# Patient Record
Sex: Male | Born: 1955
Health system: Southern US, Community
[De-identification: ages and names within clinical notes are randomized; demographics above are authoritative.]

## PROBLEM LIST (undated history)

## (undated) DIAGNOSIS — I2510 Atherosclerotic heart disease of native coronary artery without angina pectoris: Secondary | ICD-10-CM

## (undated) DIAGNOSIS — G43909 Migraine, unspecified, not intractable, without status migrainosus: Secondary | ICD-10-CM

## (undated) DIAGNOSIS — E7800 Pure hypercholesterolemia, unspecified: Secondary | ICD-10-CM

## (undated) DIAGNOSIS — J309 Allergic rhinitis, unspecified: Secondary | ICD-10-CM

## (undated) DIAGNOSIS — E785 Hyperlipidemia, unspecified: Secondary | ICD-10-CM

## (undated) DIAGNOSIS — G4730 Sleep apnea, unspecified: Secondary | ICD-10-CM

## (undated) DIAGNOSIS — E669 Obesity, unspecified: Secondary | ICD-10-CM

## (undated) DIAGNOSIS — I214 Non-ST elevation (NSTEMI) myocardial infarction: Secondary | ICD-10-CM

## (undated) DIAGNOSIS — J45909 Unspecified asthma, uncomplicated: Secondary | ICD-10-CM

## (undated) HISTORY — PX: CARDIAC CATHETERIZATION: SHX172

## (undated) HISTORY — DX: Obesity, unspecified: E66.9

## (undated) HISTORY — DX: Unspecified asthma, uncomplicated: J45.909

## (undated) HISTORY — DX: Allergic rhinitis, unspecified: J30.9

## (undated) HISTORY — DX: Non-ST elevation (NSTEMI) myocardial infarction: I21.4

## (undated) HISTORY — PX: NASAL SINUS SURGERY: SHX719

## (undated) HISTORY — DX: Atherosclerotic heart disease of native coronary artery without angina pectoris: I25.10

## (undated) HISTORY — DX: Pure hypercholesterolemia, unspecified: E78.00

## (undated) HISTORY — DX: Migraine, unspecified, not intractable, without status migrainosus: G43.909

## (undated) HISTORY — DX: Sleep apnea, unspecified: G47.30

## (undated) HISTORY — DX: Hyperlipidemia, unspecified: E78.5

---

## 1998-07-10 ENCOUNTER — Emergency Department (HOSPITAL_COMMUNITY): Admission: EM | Admit: 1998-07-10 | Discharge: 1998-07-10 | Payer: Self-pay | Admitting: Emergency Medicine

## 1998-07-10 ENCOUNTER — Encounter: Payer: Self-pay | Admitting: Endocrinology

## 2002-05-21 ENCOUNTER — Ambulatory Visit (HOSPITAL_COMMUNITY): Admission: RE | Admit: 2002-05-21 | Discharge: 2002-05-21 | Payer: Self-pay | Admitting: Gastroenterology

## 2006-05-03 ENCOUNTER — Inpatient Hospital Stay (HOSPITAL_COMMUNITY): Admission: RE | Admit: 2006-05-03 | Discharge: 2006-05-05 | Payer: Self-pay | Admitting: Cardiology

## 2006-05-03 ENCOUNTER — Encounter: Payer: Self-pay | Admitting: Emergency Medicine

## 2008-05-07 IMAGING — CR DG CHEST 2V
2 series · 2 of 2 positions shown · non-contrast
Comparison: none

HISTORY: Chest pain, dyspnea, hypertension

CHEST 2 VIEWS: 
Normal heart size, mediastinal contours, and vascularity.
Lungs clear.
No effusion or pneumothorax.
Bones unremarkable.

[w chest pa]
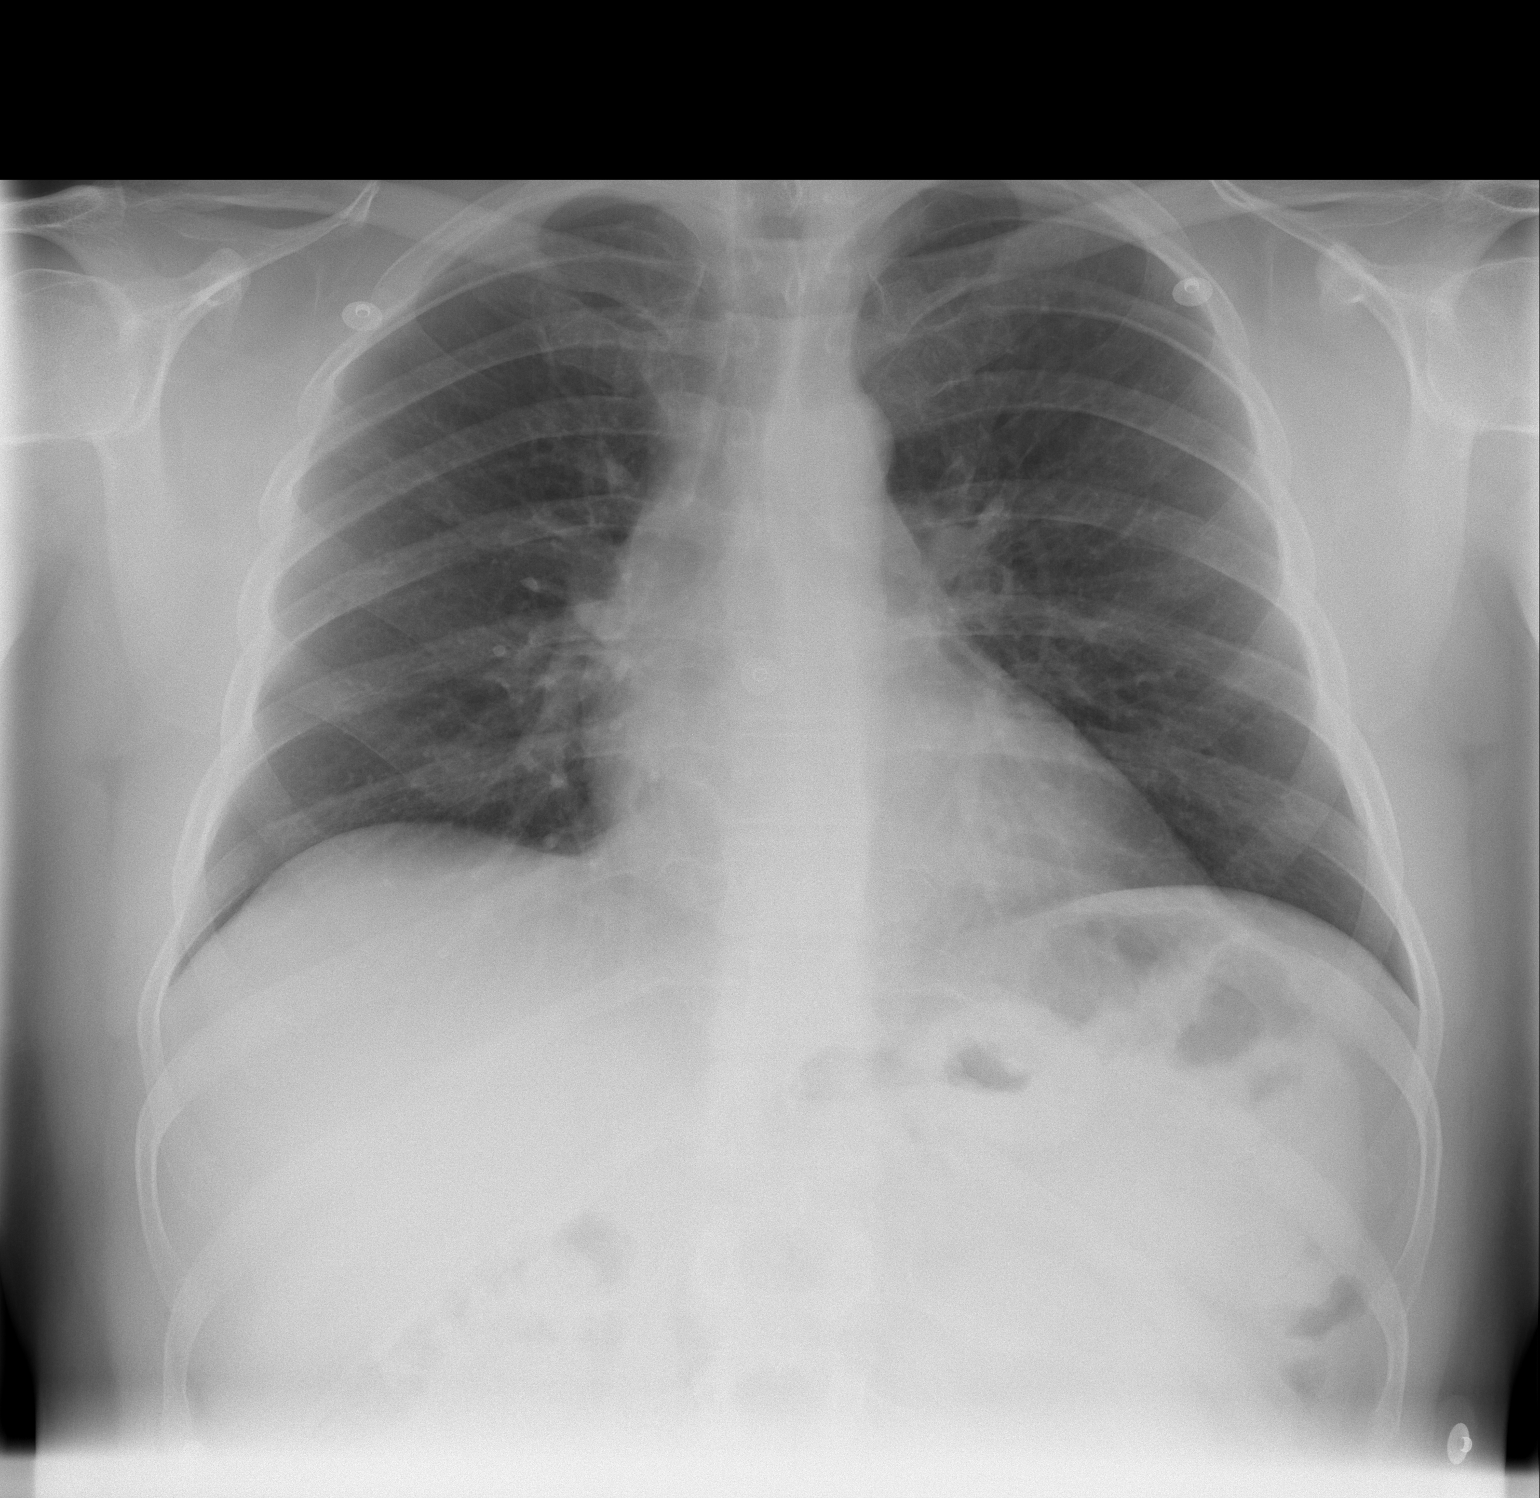

[w chest lat *]
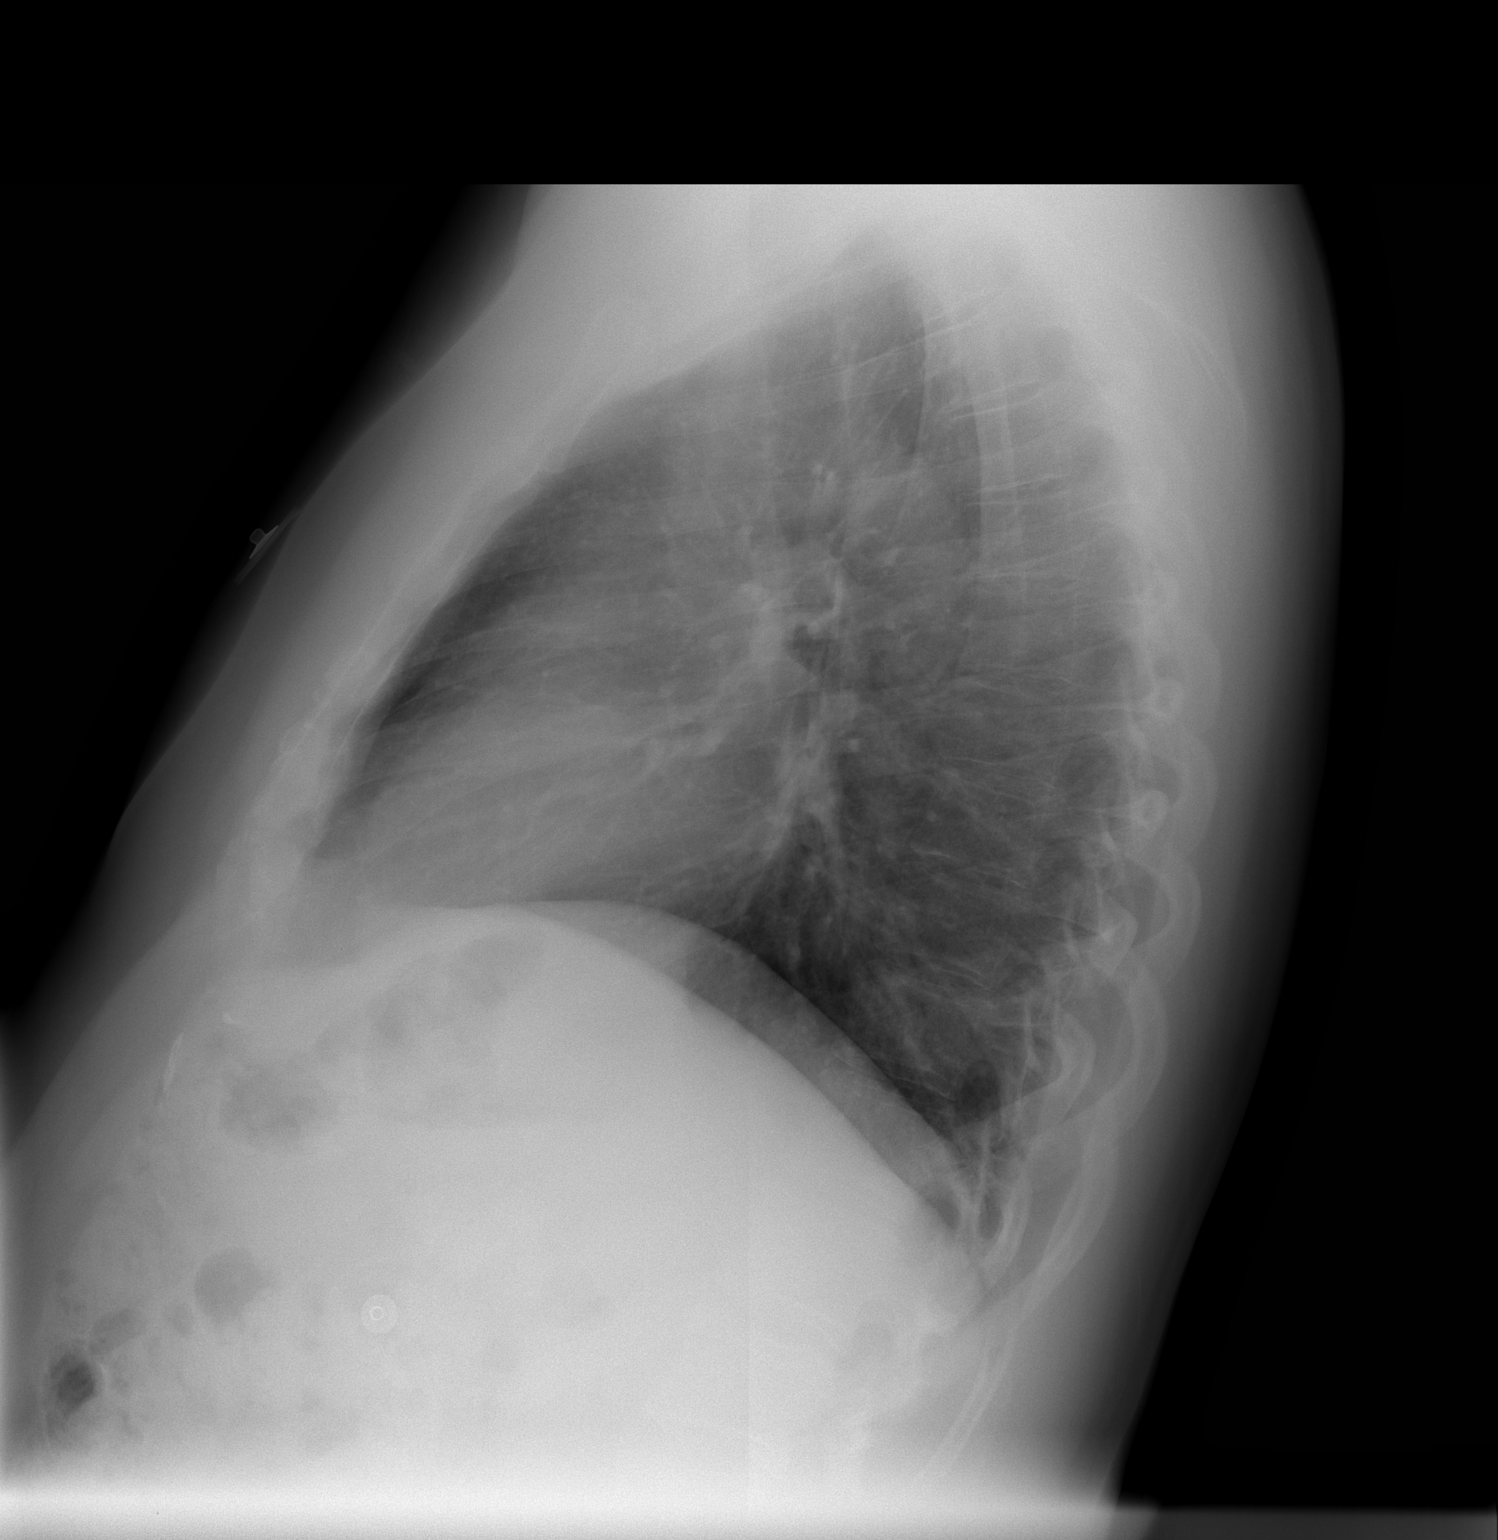

[2 of 2 positions shown; findings below may reference images not displayed]

IMPRESSION: No acute abnormalities.

## 2012-08-07 ENCOUNTER — Other Ambulatory Visit: Payer: Self-pay | Admitting: Cardiovascular Disease

## 2012-08-07 NOTE — Telephone Encounter (Signed)
Patient has not been seen since 02/28/2011. Has has multiple warnings. Rx request denied, sent to pharmacy electronically.

## 2013-03-10 ENCOUNTER — Encounter: Payer: Self-pay | Admitting: Cardiology

## 2013-03-10 ENCOUNTER — Encounter: Payer: Self-pay | Admitting: *Deleted

## 2013-03-17 ENCOUNTER — Ambulatory Visit: Payer: Self-pay | Admitting: Cardiology

## 2013-03-26 ENCOUNTER — Ambulatory Visit: Payer: 59 | Admitting: Cardiology

## 2013-04-02 ENCOUNTER — Encounter: Payer: Self-pay | Admitting: General Surgery

## 2013-04-02 DIAGNOSIS — E669 Obesity, unspecified: Secondary | ICD-10-CM | POA: Insufficient documentation

## 2013-04-02 DIAGNOSIS — G4733 Obstructive sleep apnea (adult) (pediatric): Secondary | ICD-10-CM | POA: Insufficient documentation

## 2013-04-03 ENCOUNTER — Ambulatory Visit: Payer: Self-pay | Admitting: Cardiology

## 2013-04-11 ENCOUNTER — Ambulatory Visit: Payer: 59 | Admitting: Cardiology

## 2013-04-29 ENCOUNTER — Encounter: Payer: Self-pay | Admitting: Cardiology

## 2013-04-29 ENCOUNTER — Ambulatory Visit (INDEPENDENT_AMBULATORY_CARE_PROVIDER_SITE_OTHER): Payer: 59 | Admitting: Cardiology

## 2013-04-29 VITALS — BP 140/80 | HR 61 | Ht 69.0 in | Wt 231.0 lb

## 2013-04-29 DIAGNOSIS — E669 Obesity, unspecified: Secondary | ICD-10-CM

## 2013-04-29 DIAGNOSIS — I252 Old myocardial infarction: Secondary | ICD-10-CM

## 2013-04-29 DIAGNOSIS — I10 Essential (primary) hypertension: Secondary | ICD-10-CM | POA: Insufficient documentation

## 2013-04-29 DIAGNOSIS — I2510 Atherosclerotic heart disease of native coronary artery without angina pectoris: Secondary | ICD-10-CM

## 2013-04-29 DIAGNOSIS — G4733 Obstructive sleep apnea (adult) (pediatric): Secondary | ICD-10-CM

## 2013-04-29 DIAGNOSIS — E7800 Pure hypercholesterolemia, unspecified: Secondary | ICD-10-CM | POA: Insufficient documentation

## 2013-04-29 MED ORDER — SIMVASTATIN 40 MG PO TABS: 40.0000 mg | ORAL_TABLET | Freq: Every day | ORAL | Status: DC

## 2013-04-29 MED ORDER — METOPROLOL SUCCINATE ER 25 MG PO TB24: 25.0000 mg | ORAL_TABLET | Freq: Every day | ORAL | Status: DC

## 2013-04-29 NOTE — Progress Notes (Signed)
[address], [address] [place], [place]  [address] Phone: [phone] Fax:  [phone]  Date:  04/29/2013   ID:  Christopher Mcpherson, DOB 02-07-1956, MRN [id number]  PCP:  Mickie Hillier, MD   History of Present Illness: Christopher Mcpherson is a 58 y.o. male with coronary artery disease, former patient of Dr. Caprice Kluver with history of myocardial infarction in 2008 where catheterization showed a small ramus branch that was occluded, EF 65%. This myocardial infarction occurred after using Imitrex for her migraine headache. Percutaneous intervention to the small branch was unsuccessful. Obstructive sleep apnea is also being followed by Dr. Mayford Knife and he is treated with BiPAP. He has been on simvastatin as well as niacin for hyperlipidemia and his last LDL was 87 (prior 76). He has been taking metoprolol once a day as well. overall, no symptoms, no syncope, no chest pain, dyspnea with exertion. He has gained some weight over the holidays likely from not exercising as much. He is no longer able to walk 3 miles a day at work.    Wt Readings from Last 3 Encounters:  04/29/13 231 lb (104.781 kg)     Past Medical History  Diagnosis Date  . Hypercholesteremia   . Allergic rhinitis   . Sleep apnea   . Migraine     followed by neurologist Dr.Lewit  . Acute myocardial infarction, subendocardial infarction, subsequent episode of care   . Obesity   . Dyslipidemia   . Allergic rhinitis   . CAD (coronary artery disease)     MI 2008 (cardio Dr. Caprice Kluver), catheterization showed a small ramus branch occluded, nonQ wave MI, ejection fraction 65%, occurred after using Imitrex for migraine, PTCA unsuccessful  . Asthma     Mild intermittent    Past Surgical History  Procedure Laterality Date  . Nasal sinus surgery    . Cardiac catheterization      Showed a small ramus branch occluded,nonQ wave MI, ejection fraction 65% occured after using Imitrex for Migraine,PTCA unsuccessful     Current Outpatient Prescriptions  Medication Sig Dispense Refill  . albuterol (PROAIR HFA) 108 (90 BASE) MCG/ACT inhaler Inhale into the lungs every 6 (six) hours as needed for wheezing or shortness of breath.      Marland Kitchen aspirin 81 MG tablet Take 81 mg by mouth daily.      . metoprolol succinate (TOPROL-XL) 25 MG 24 hr tablet Take 25 mg by mouth daily.      . niacin 500 MG tablet Take 1,500 mg by mouth at bedtime.       . Omega-3 Fatty Acids (FISH OIL) 1000 MG CAPS Take 1 capsule by mouth daily.      . simvastatin (ZOCOR) 40 MG tablet Take 40 mg by mouth daily.      Marland Kitchen triamcinolone (NASACORT AQ) 55 MCG/ACT AERO nasal inhaler Place 1 spray into the nose daily.       No current facility-administered medications for this visit.    Allergies:    Allergies  Allergen Reactions  . Imitrex [Sumatriptan] Other (See Comments)    Myocardial infarction    Social History:  The patient  reports that he has never smoked. He does not have any smokeless tobacco history on file. He reports that he drinks alcohol. He reports that he does not use illicit drugs.   ROS:  Please see the history of present illness.   Denies any fevers, chills, orthopnea, PND  PHYSICAL EXAM: VS:  BP 140/80  Pulse 61  Ht 5\' 9"  (1.753 m)  Wt 231 lb (104.781 kg)  BMI 34.10 kg/m2 Well nourished, well developed, in no acute distress HEENT: normal Neck: no JVD Cardiac:  normal S1, S2; RRR; no murmur Lungs:  clear to auscultation bilaterally, no wheezing, rhonchi or rales Abd: soft, nontender, no hepatomegaly Ext: no edema Skin: warm and dry Neuro: no focal abnormalities noted  EKG:  Sinus rhythm, 61, T wave inversion, mild aVL, borderline LVH.    Labs: 4/14-LDL 87, previously 75 ASSESSMENT AND PLAN:  1. Coronary artery disease - stable. No active anginal symptoms. Doing well. Continue with aggressive secondary prevention. 2. Hyperlipidemia-LDL most recently 87. He will be having this checked by Dr. Clarene DukeLittle soon. LDL  goal should be 70. Continue for now with current medications. Diet, exercise. 3. Obesity-continue to encourage weight loss. He states that he has changed jobs 3 times in the past year and this has inhibited his workout routine. 4. Hypertension-mildly elevated today however normally is in the 120 range at home. Continue with current medications. 5. OSA - Dr. Mayford Knifeurner 6. Old myocardial infarction - 2008, ramus branch, no PCI. 7. Follow up in one year.  Signed, Donato SchultzMark Landy Dunnavant, MD [facility]  04/29/2013 12:11 PM

## 2013-04-29 NOTE — Patient Instructions (Signed)
Your physician recommends that you continue on your current medications as directed. Please refer to the Current Medication list given to you today.  Your physician wants you to follow-up in: 1 year with Dr. Skains. You will receive a reminder letter in the mail two months in advance. If you don't receive a letter, please call our office to schedule the follow-up appointment.  

## 2014-05-04 ENCOUNTER — Other Ambulatory Visit: Payer: Self-pay | Admitting: Cardiology

## 2015-01-05 ENCOUNTER — Ambulatory Visit (INDEPENDENT_AMBULATORY_CARE_PROVIDER_SITE_OTHER): Payer: BLUE CROSS/BLUE SHIELD | Admitting: Cardiology

## 2015-01-05 ENCOUNTER — Encounter: Payer: Self-pay | Admitting: Cardiology

## 2015-01-05 VITALS — BP 120/70 | HR 73 | Ht 68.0 in | Wt 234.1 lb

## 2015-01-05 DIAGNOSIS — E669 Obesity, unspecified: Secondary | ICD-10-CM | POA: Diagnosis not present

## 2015-01-05 DIAGNOSIS — G4733 Obstructive sleep apnea (adult) (pediatric): Secondary | ICD-10-CM

## 2015-01-05 MED ORDER — METOPROLOL SUCCINATE ER 25 MG PO TB24: 25.0000 mg | ORAL_TABLET | Freq: Every day | ORAL | Status: DC

## 2015-01-05 MED ORDER — SIMVASTATIN 40 MG PO TABS: 40.0000 mg | ORAL_TABLET | Freq: Every day | ORAL | Status: DC

## 2015-01-05 NOTE — Progress Notes (Signed)
Cardiology Office Note   Date:  01/05/2015   ID:  Christopher Mcpherson, DOB October 14, 1955, MRN [id number]  PCP:  Mickie Hillier, MD    Chief Complaint  Patient presents with  . Sleep Apnea    refilled card meds      History of Present Illness: Christopher Mcpherson is a 59 y.o. male who presents for followup of his OSA.  He has OSA and is on BiPAP.  He uses a full face mask which he tolerates well.  He is on BiPAP at 12/8cm H2O.  He feels rested when he gets up in the am.  He does not snore when using the BiPAP.  He has no daytime sleepiness.  He uses [facility] for his supplies.  He sleeps on his side.  He has problems with allergies so he does have some nasal congestion but is controlled on nasal saline spray.    Past Medical History  Diagnosis Date  . Hypercholesteremia   . Allergic rhinitis   . Sleep apnea   . Migraine     followed by neurologist Dr.Lewit  . Acute myocardial infarction, subendocardial infarction, subsequent episode of care (HCC)   . Obesity   . Dyslipidemia   . Allergic rhinitis   . CAD (coronary artery disease)     MI 2008 (cardio Dr. Caprice Kluver), catheterization showed a small ramus branch occluded, nonQ wave MI, ejection fraction 65%, occurred after using Imitrex for migraine, PTCA unsuccessful  . Asthma     Mild intermittent    Past Surgical History  Procedure Laterality Date  . Nasal sinus surgery    . Cardiac catheterization      Showed a small ramus branch occluded,nonQ wave MI, ejection fraction 65% occured after using Imitrex for Migraine,PTCA unsuccessful     Current Outpatient Prescriptions  Medication Sig Dispense Refill  . albuterol (PROAIR HFA) 108 (90 BASE) MCG/ACT inhaler Inhale into the lungs every 6 (six) hours as needed for wheezing or shortness of breath.    Marland Kitchen aspirin 81 MG tablet Take 81 mg by mouth daily.    . metoprolol succinate (TOPROL-XL) 25 MG 24 hr tablet TAKE 1 TABLET BY MOUTH EVERY DAY 30  tablet 0  . niacin 500 MG tablet Take 1,500 mg by mouth at bedtime.     . Omega-3 Fatty Acids (FISH OIL) 1000 MG CAPS Take 1 capsule by mouth daily.    . simvastatin (ZOCOR) 40 MG tablet TAKE 1 TABLET BY MOUTH EVERY DAY 30 tablet 0  . triamcinolone (NASACORT AQ) 55 MCG/ACT AERO nasal inhaler Place 1 spray into the nose daily.     No current facility-administered medications for this visit.    Allergies:   Imitrex    Social History:  The patient  reports that he has never smoked. He does not have any smokeless tobacco history on file. He reports that he drinks alcohol. He reports that he does not use illicit drugs.   Family History:  The patient's family history includes Diabetes in his father.    ROS:  Please see the history of present illness.   Otherwise, review of systems are positive for none.   All other systems are reviewed and negative.    PHYSICAL EXAM: VS:  BP 120/70 mmHg  Pulse 73  Ht  (1.727 m)  Wt 106.196 kg (234 lb 1.9 oz)  BMI 35.61 kg/m2 ,  BMI Body mass index is 35.61 kg/(m^2). GEN: Well nourished, well developed, in no acute distress HEENT: normal Neck: no JVD, carotid bruits, or masses Cardiac: RRR; no murmurs, rubs, or gallops,no edema  Respiratory:  clear to auscultation bilaterally, normal work of breathing GI: soft, nontender, nondistended, + BS MS: no deformity or atrophy Skin: warm and dry, no rash Neuro:  Strength and sensation are intact Psych: euthymic mood, full affect   EKG:  EKG is not ordered today.    Recent Labs: No results found for requested labs within last 365 days.    Lipid Panel No results found for: CHOL, TRIG, HDL, CHOLHDL, VLDL, LDLCALC, LDLDIRECT    Wt Readings from Last 3 Encounters:  01/05/15 106.196 kg (234 lb 1.9 oz)  04/29/13 104.781 kg (231 lb)       ASSESSMENT AND PLAN:  1.  OSA on BiPAP and doing well.  [facility].  Patient has been using and benefiting from CPAP use and  will continue to benefit from therapy.  2.  Obesity - He walks 2 miles daily.     Current medicines are reviewed at length with the patient today.  The patient does not have concerns regarding medicines.  The following changes have been made:  no change  Labs/ tests ordered today: See above Assessment and Plan No orders of the defined types were placed in this encounter.     Disposition:   FU with me in 1 year  Signed, Quintella ReichertURNER,TRACI R, MD  01/05/2015 9:03 AM    [facility] [address], [place], [place]  [address] Phone: [phone]; Fax: [phone]

## 2015-01-05 NOTE — Patient Instructions (Signed)
Medication Instructions:  Your physician recommends that you continue on your current medications as directed. Please refer to the Current Medication list given to you today.   Labwork: None  Testing/Procedures: None  Follow-Up: Your physician recommends that you schedule a follow-up appointment with Dr. Anne FuSkains.  Your physician wants you to follow-up in: 1 year with Dr. Mayford Knifeurner. You will receive a reminder letter in the mail two months in advance. If you don't receive a letter, please call our office to schedule the follow-up appointment.   Any Other Special Instructions Will Be Listed Below (If Applicable). Orders have been placed for you to be set up with [facility]. If you do not hear from them within the next week, call Toma CopierBethany, CMA directly at [phone]. She is our office CPAP assistant.   If you need a refill on your cardiac medications before your next appointment, please call your pharmacy.

## 2015-01-06 ENCOUNTER — Telehealth: Payer: Self-pay | Admitting: *Deleted

## 2015-01-06 NOTE — Telephone Encounter (Signed)
Then following is copied from messages with [facility]  ----- Message -----   From: Arcola JanskyBethany M Cook, CMA   Sent: 01/06/2015  9:40 AM    To: Henderson NewcomerMelissa Stenson, Henrietta DineKathryn A Kemp, RN  Subject: RE: DME order                   I have made some phone calls on getting this. Not sure that it will be possible - but if so, it'll take a few days. His sleep study was done as a part of [facility]....both of which are not operating now, so I'm trying to track down where those records went.   Will keep you updated.  Patient is aware that I am trying to get records.    Thanks,  Toma CopierBethany  ----- Message -----   From: Henderson NewcomerMelissa Stenson   Sent: 01/05/2015  6:14 PM    To: Arcola JanskyBethany M Cook, CMA, Henrietta DineKathryn A Kemp, RN  Subject: RE: DME order                   Hey ladies. Since he'll be new to us, we'll need a copy of his sleep study. I don't see a copy in his chart.  Can y'all get a copy?  ----- Message -----   From: Henrietta DineKathryn A Kemp, RN   Sent: 01/05/2015  9:09 AM    To: Henderson NewcomerMelissa Stenson, Shayley Medlin Jenelle MagesM Cook, [place]  Subject: DME order                     [facility]   Can we get a download too?   Thanks!!

## 2015-02-19 ENCOUNTER — Encounter: Payer: Self-pay | Admitting: Cardiology

## 2015-02-19 ENCOUNTER — Ambulatory Visit (INDEPENDENT_AMBULATORY_CARE_PROVIDER_SITE_OTHER): Payer: BLUE CROSS/BLUE SHIELD | Admitting: Cardiology

## 2015-02-19 VITALS — BP 124/74 | HR 65 | Ht 68.0 in | Wt 238.4 lb

## 2015-02-19 DIAGNOSIS — I10 Essential (primary) hypertension: Secondary | ICD-10-CM

## 2015-02-19 DIAGNOSIS — I2510 Atherosclerotic heart disease of native coronary artery without angina pectoris: Secondary | ICD-10-CM

## 2015-02-19 DIAGNOSIS — G4733 Obstructive sleep apnea (adult) (pediatric): Secondary | ICD-10-CM | POA: Diagnosis not present

## 2015-02-19 DIAGNOSIS — I2583 Coronary atherosclerosis due to lipid rich plaque: Principal | ICD-10-CM

## 2015-02-19 DIAGNOSIS — E669 Obesity, unspecified: Secondary | ICD-10-CM

## 2015-02-19 DIAGNOSIS — E7800 Pure hypercholesterolemia, unspecified: Secondary | ICD-10-CM

## 2015-02-19 DIAGNOSIS — I252 Old myocardial infarction: Secondary | ICD-10-CM

## 2015-02-19 NOTE — Patient Instructions (Signed)

## 2015-02-19 NOTE — Progress Notes (Signed)
[address], [address] [place], [place]  [address] Phone: [phone] Fax:  [phone]  Date:  02/19/2015   ID:  Christopher Mcpherson, DOB 1955-10-31, MRN [id number]  PCP:  Mickie Hillier, MD   History of Present Illness: Christopher Mcpherson is a 60 y.o. male with coronary artery disease, former patient of Dr. Caprice Kluver with history of myocardial infarction in 2008 where catheterization showed a small ramus branch that was occluded, EF 65%.   Myocardial infarction occurred after using Imitrex for her migraine headache. Percutaneous intervention to the small branch was unsuccessful.   Obstructive sleep apnea is also being followed by Dr. Mayford Knife and he is treated with BiPAP.   Simvastatin has been very successful at reducing his LDL cholesterol. He was off of simvastatin for 2 months and his LDL went up to 199. He is now back on, Dr. Clarene Duke has addressed. He will be rechecking.  He has been taking metoprolol once a day as well. overall, no symptoms, no syncope, no chest pain, dyspnea with exertion.   He has gained some weight over the holidays. He says he eats well and he is trying to control portion size.    Wt Readings from Last 3 Encounters:  02/19/15 238 lb 6.4 oz (108.138 kg)  01/05/15 234 lb 1.9 oz (106.196 kg)  04/29/13 231 lb (104.781 kg)     Past Medical History  Diagnosis Date  . Hypercholesteremia   . Allergic rhinitis   . Sleep apnea   . Migraine     followed by neurologist Dr.Lewit  . Acute myocardial infarction, subendocardial infarction, subsequent episode of care (HCC)   . Obesity   . Dyslipidemia   . Allergic rhinitis   . CAD (coronary artery disease)     MI 2008 (cardio Dr. Caprice Kluver), catheterization showed a small ramus branch occluded, nonQ wave MI, ejection fraction 65%, occurred after using Imitrex for migraine, PTCA unsuccessful  . Asthma     Mild intermittent    Past Surgical History  Procedure Laterality Date  . Nasal sinus  surgery    . Cardiac catheterization      Showed a small ramus branch occluded,nonQ wave MI, ejection fraction 65% occured after using Imitrex for Migraine,PTCA unsuccessful    Current Outpatient Prescriptions  Medication Sig Dispense Refill  . albuterol (PROAIR HFA) 108 (90 BASE) MCG/ACT inhaler Inhale into the lungs every 6 (six) hours as needed for wheezing or shortness of breath.    Marland Kitchen aspirin 81 MG tablet Take 81 mg by mouth daily.    . metoprolol succinate (TOPROL-XL) 25 MG 24 hr tablet Take 1 tablet (25 mg total) by mouth daily. 90 tablet 3  . niacin 500 MG tablet Take 1,500 mg by mouth at bedtime.     . Omega-3 Fatty Acids (FISH OIL) 1000 MG CAPS Take 1 capsule by mouth daily.    . simvastatin (ZOCOR) 40 MG tablet Take 1 tablet (40 mg total) by mouth daily. 90 tablet 3  . triamcinolone (NASACORT AQ) 55 MCG/ACT AERO nasal inhaler Place 1 spray into the nose daily.     No current facility-administered medications for this visit.    Allergies:    Allergies  Allergen Reactions  . Imitrex [Sumatriptan] Other (See Comments)    Myocardial infarction    Social History:  The patient  reports that he has never smoked. He does not have any smokeless tobacco history on file. He reports that he drinks  alcohol. He reports that he does not use illicit drugs.   ROS:  Please see the history of present illness.   Denies any fevers, chills, orthopnea, PND    PHYSICAL EXAM: VS:  BP 124/74 mmHg  Pulse 65  Ht 5\' 8"  (1.727 m)  Wt 238 lb 6.4 oz (108.138 kg)  BMI 36.26 kg/m2 Well nourished, well developed, in no acute distress HEENT: normal Neck: no JVD Cardiac:  normal S1, S2; RRR; no murmur Lungs:  clear to auscultation bilaterally, no wheezing, rhonchi or rales Abd: soft, nontender, no hepatomegaly Ext: no edema Skin: warm and dry Neuro: no focal abnormalities noted  EKG:  Today 02/19/15-sinus rhythm, 66, nonspecific T-wave changes, slightly biphasic in V4 through V6. Personally viewed-no  prior change from Sinus rhythm, 61, T wave inversion, mild aVL, borderline LVH.     Labs: 4/14-LDL 87, previously 75  ASSESSMENT AND PLAN:  1. Coronary artery disease - stable. No active anginal symptoms. Doing well. Continue with aggressive secondary prevention. 2. Hyperlipidemia-LDL was 87, now 199 on 01/02/15. He admits that he was off of his simvastatin approximate 2 months prior to this high LDL value. He is now back on. Dr. Clarene DukeLittle will be rechecking. LDL goal should be 70 or at least 50% reduction. Continue for now with current medications. Diet, exercise. He has also been taking fish oil as well as niacin. 3. Obesity-continue to encourage weight loss. He states that he has changed jobs 3 times in the past year and this has inhibited his workout routine. We discussed low carbohydrates. 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 4. Hypertension-normal today. Continue with current medications. 5. OSA - Dr. Mayford Knifeurner. He wears this nightly. 6. Old myocardial infarction - 2008, ramus branch, no PCI. Continue with aggressive medical management. 7. Follow up in one year.  Signed, Donato SchultzMark Skains, MD [facility]  02/19/2015 8:42 AM

## 2015-09-30 DIAGNOSIS — G4731 Primary central sleep apnea: Secondary | ICD-10-CM | POA: Diagnosis not present

## 2015-11-17 DIAGNOSIS — Z23 Encounter for immunization: Secondary | ICD-10-CM | POA: Diagnosis not present

## 2016-02-11 DIAGNOSIS — H52223 Regular astigmatism, bilateral: Secondary | ICD-10-CM | POA: Diagnosis not present

## 2016-02-11 DIAGNOSIS — H5213 Myopia, bilateral: Secondary | ICD-10-CM | POA: Diagnosis not present

## 2016-02-11 DIAGNOSIS — H524 Presbyopia: Secondary | ICD-10-CM | POA: Diagnosis not present

## 2016-03-21 ENCOUNTER — Other Ambulatory Visit: Payer: Self-pay | Admitting: Cardiology

## 2016-03-24 ENCOUNTER — Encounter (INDEPENDENT_AMBULATORY_CARE_PROVIDER_SITE_OTHER): Payer: Self-pay

## 2016-03-24 ENCOUNTER — Encounter: Payer: Self-pay | Admitting: Cardiology

## 2016-03-24 ENCOUNTER — Ambulatory Visit (INDEPENDENT_AMBULATORY_CARE_PROVIDER_SITE_OTHER): Payer: BLUE CROSS/BLUE SHIELD | Admitting: Cardiology

## 2016-03-24 VITALS — BP 130/78 | HR 64 | Ht 68.0 in | Wt 237.0 lb

## 2016-03-24 DIAGNOSIS — G4733 Obstructive sleep apnea (adult) (pediatric): Secondary | ICD-10-CM

## 2016-03-24 DIAGNOSIS — I252 Old myocardial infarction: Secondary | ICD-10-CM | POA: Diagnosis not present

## 2016-03-24 DIAGNOSIS — I10 Essential (primary) hypertension: Secondary | ICD-10-CM

## 2016-03-24 DIAGNOSIS — I2510 Atherosclerotic heart disease of native coronary artery without angina pectoris: Secondary | ICD-10-CM | POA: Diagnosis not present

## 2016-03-24 DIAGNOSIS — R9431 Abnormal electrocardiogram [ECG] [EKG]: Secondary | ICD-10-CM

## 2016-03-24 MED ORDER — METOPROLOL SUCCINATE ER 25 MG PO TB24: ORAL_TABLET | ORAL | 3 refills | Status: DC

## 2016-03-24 MED ORDER — SIMVASTATIN 40 MG PO TABS: ORAL_TABLET | ORAL | 3 refills | Status: DC

## 2016-03-24 NOTE — Patient Instructions (Addendum)
Medication Instructions:  The current medical regimen is effective;  continue present plan and medications.  Testing/Procedures: Your physician has requested that you have a myoview. For further information please visit [email or web address]. Please follow instruction sheet, as given.  Follow-Up: Follow up in 1 year with Dr. Skains.  You will receive a letter in the mail 2 months before you are due.  Please call us when you receive this letter to schedule your follow up appointment.  If you need a refill on your cardiac medications before your next appointment, please call your pharmacy.  Thank you for choosing  [facility]!!     

## 2016-03-24 NOTE — Progress Notes (Signed)
[address] [place], [place]  [address] Phone: [phone] Fax:  [phone]) [phone]  Date:  03/24/2016   ID:  Christopher Mcpherson, DOB [birth year], MRN [id number]  PCP:  Mickie HillierLITTLE,KEVIN LORNE, MD   History of Present Illness: Christopher Mcpherson is a 61 y.o. male with coronary artery disease, former patient of Dr. Caprice KluverAl Little with history of myocardial infarction in 2008 where catheterization showed a small ramus branch that was occluded, EF 65%.   Myocardial infarction occurred after using Imitrex for her migraine headache. Percutaneous intervention to the small branch was unsuccessful.   Obstructive sleep apnea is also being followed by Dr. Mayford Knifeurner and he is treated with BiPAP.   Simvastatin has been very successful at reducing his LDL cholesterol. He was off of simvastatin for 2 months and his LDL went up to 199. He is now back on, Dr. Clarene DukeLittle has addressed. He will be rechecking.  He has been taking metoprolol once a day as well. overall, no symptoms, no syncope, no chest pain, dyspnea with exertion.   His EKG with biphasic T-wave in V3 and T-wave inversions in anterolateral leads may be indicative of ischemia.    Wt Readings from Last 3 Encounters:  03/24/16 237 lb (107.5 kg)  02/19/15 238 lb 6.4 oz (108.1 kg)  01/05/15 234 lb 1.9 oz (106.2 kg)     Past Medical History:  Diagnosis Date  . Acute myocardial infarction, subendocardial infarction, subsequent episode of care (HCC)   . Allergic rhinitis   . Allergic rhinitis   . Asthma    Mild intermittent  . CAD (coronary artery disease)    MI 2008 (cardio Dr. Caprice KluverAl Little), catheterization showed a small ramus branch occluded, nonQ wave MI, ejection fraction 65%, occurred after using Imitrex for migraine, PTCA unsuccessful  . Dyslipidemia   . Hypercholesteremia   . Migraine    followed by neurologist Dr.Lewit  . Obesity   . Sleep apnea     Past Surgical History:  Procedure Laterality Date  . CARDIAC  CATHETERIZATION     Showed a small ramus branch occluded,nonQ wave MI, ejection fraction 65% occured after using Imitrex for Migraine,PTCA unsuccessful  . NASAL SINUS SURGERY      Current Outpatient Prescriptions  Medication Sig Dispense Refill  . albuterol (PROAIR HFA) 108 (90 BASE) MCG/ACT inhaler Inhale into the lungs every 6 (six) hours as needed for wheezing or shortness of breath.    Marland Kitchen. aspirin 81 MG tablet Take 81 mg by mouth daily.    . metoprolol succinate (TOPROL-XL) 25 MG 24 hr tablet TAKE 1 TABLET(25 MG) BY MOUTH DAILY 90 tablet 3  . niacin 500 MG tablet Take 1,500 mg by mouth at bedtime.     . Omega-3 Fatty Acids (FISH OIL) 1000 MG CAPS Take 1 capsule by mouth daily.    . simvastatin (ZOCOR) 40 MG tablet TAKE 1 TABLET(40 MG) BY MOUTH DAILY 90 tablet 3  . triamcinolone (NASACORT AQ) 55 MCG/ACT AERO nasal inhaler Place 1 spray into the nose daily.     No current facility-administered medications for this visit.     Allergies:    Allergies  Allergen Reactions  . Imitrex [Sumatriptan] Other (See Comments)    Myocardial infarction    Social History:  The patient  reports that he has never smoked. He has never used smokeless tobacco. He reports that he drinks alcohol. He reports that he does not use drugs.   ROS:  Please  see the history of present illness.   Denies any fevers, chills, orthopnea, PND    PHYSICAL EXAM: VS:  BP 130/78   Pulse 64   Ht 5\' 8"  (1.727 m)   Wt 237 lb (107.5 kg)   BMI 36.04 kg/m  Well nourished, well developed, in no acute distress HEENT: normal Neck: no JVD Cardiac:  normal S1, S2; RRR; no murmur Lungs:  clear to auscultation bilaterally, no wheezing, rhonchi or rales Abd: soft, nontender, no hepatomegaly Ext: no edema Skin: warm and dry Neuro: no focal abnormalities noted  EKG:  Today 03/24/16-sinus rhythm 64 with T-wave inversion in the anterolateral leads, biphasic in V3 personally viewed-no significant change in prior however 2015 does  look different. T-wave inversion also noted in 1 and aVL. 02/19/15-sinus rhythm, 66, nonspecific T-wave changes, slightly biphasic in V4 through V6. Personally viewed-no prior change from Sinus rhythm, 61, T wave inversion, mild aVL, borderline LVH.     Labs: 4/14-LDL 87, previously 75  ASSESSMENT AND PLAN:  1. Coronary artery disease - stable. No active anginal symptoms. However, he does have biphasic T-wave in V3 with T-wave inversion fairly diffusely. I would like to check a nuclear stress test to ensure that he does not have any new evidence of ischemia or high risk features. Back in 2015, his EKG did not demonstrate all of these T-wave inversions. Continue with aggressive secondary prevention. 2. Hyperlipidemia-LDL was 87, now 199 on 01/02/15. He admits that he was off of his simvastatin approximate 2 months prior to this high LDL value. He is now back on. Dr. Clarene Duke will be rechecking. LDL goal should be 70 or at least 50% reduction. Continue for now with current medications. Diet, exercise. He has also been taking fish oil as well as niacin. 3. Obesity-continue to encourage weight loss. He states that he has changed jobs 3 times in the past year and this has inhibited his workout routine. We discussed low carbohydrates. 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 4. Hypertension-normal today. Continue with current medications. 5. OSA - Dr. Mayford Knife. He wears this nightly. 6. Old myocardial infarction - 2008, ramus branch, no PCI. Continue with aggressive medical management. 7. Follow up in one year.  Signed, Donato Schultz, MD [facility]  03/24/2016 10:54 AM

## 2016-03-29 ENCOUNTER — Telehealth (HOSPITAL_COMMUNITY): Payer: Self-pay | Admitting: *Deleted

## 2016-03-29 NOTE — Telephone Encounter (Signed)
Patient given detailed instructions per Myocardial Perfusion Study Information Sheet for the test on 04/03/16 at 0945. Patient notified to arrive 15 minutes early and that it is imperative to arrive on time for appointment to keep from having the test rescheduled.  If you need to cancel or reschedule your appointment, please call the office within 24 hours of your appointment. Failure to do so may result in a cancellation of your appointment, and a $50 no show fee. Patient verbalized understanding.Christopher Mcpherson, Adelene IdlerCynthia W

## 2016-04-03 ENCOUNTER — Ambulatory Visit (HOSPITAL_COMMUNITY): Payer: BLUE CROSS/BLUE SHIELD | Attending: Cardiology

## 2016-04-03 DIAGNOSIS — I10 Essential (primary) hypertension: Secondary | ICD-10-CM | POA: Insufficient documentation

## 2016-04-03 DIAGNOSIS — R0609 Other forms of dyspnea: Secondary | ICD-10-CM | POA: Diagnosis not present

## 2016-04-03 DIAGNOSIS — I2510 Atherosclerotic heart disease of native coronary artery without angina pectoris: Secondary | ICD-10-CM

## 2016-04-03 LAB — MYOCARDIAL PERFUSION IMAGING
CHL CUP NUCLEAR SRS: 5
CHL CUP NUCLEAR SSS: 5
CHL CUP RESTING HR STRESS: 65 {beats}/min
CHL RATE OF PERCEIVED EXERTION: 15
CSEPED: 8 min
CSEPEW: 10.1 METS
CSEPPHR: 160 {beats}/min
LHR: 0.33
LV dias vol: 126 mL (ref 62–150)
LV sys vol: 54 mL
MPHR: 162 {beats}/min
NUC STRESS TID: 1.09
Percent HR: 98 %
SDS: 0

## 2016-04-03 MED ORDER — TECHNETIUM TC 99M TETROFOSMIN IV KIT
10.5000 | PACK | Freq: Once | INTRAVENOUS | Status: AC | PRN
  Administered 2016-04-03: 10.5 via INTRAVENOUS
  Filled 2016-04-03: qty 11

## 2016-04-03 MED ORDER — TECHNETIUM TC 99M TETROFOSMIN IV KIT
31.8000 | PACK | Freq: Once | INTRAVENOUS | Status: AC | PRN
  Administered 2016-04-03: 31.8 via INTRAVENOUS
  Filled 2016-04-03: qty 32

## 2016-04-07 ENCOUNTER — Telehealth: Payer: Self-pay | Admitting: Cardiology

## 2016-04-07 NOTE — Telephone Encounter (Signed)
Pt aware of results of recent testing.

## 2016-04-07 NOTE — Telephone Encounter (Signed)
New message ° ° ° ° ° °Returning a call to the nurse to get test results °

## 2017-01-08 DIAGNOSIS — J9801 Acute bronchospasm: Secondary | ICD-10-CM | POA: Diagnosis not present

## 2017-01-08 DIAGNOSIS — J309 Allergic rhinitis, unspecified: Secondary | ICD-10-CM | POA: Diagnosis not present

## 2017-02-19 NOTE — Progress Notes (Signed)
Cardiology Office Note:    Date:  02/20/2017   ID:  Christopher LogeMichael S Mitchener, DOB 1956/02/02, MRN [id number]  PCP:  Catha GosselinLittle, Kevin, MD  Cardiologist:  Donato SchultzMark Skains, MD   Referring MD: Catha GosselinLittle, Kevin, MD   Chief Complaint  Patient presents with  . Follow-up  . Sleep Apnea    History of Present Illness:    Christopher Mcpherson is a 62 y.o. male with a hx of OSA and is on BiPAP.  He has not been seen by me in several years.  He is doing well with his CPAP device.  He tolerates the full face mask and feels the pressure is adequate.  Since going on CPAP he feels rested in the am and has no significant daytime sleepiness.  He denies any significant mouth or nasal dryness or nasal congestion except for allergies.  He does not think that he snores.    Past Medical History:  Diagnosis Date  . Acute myocardial infarction, subendocardial infarction, subsequent episode of care (HCC)   . Allergic rhinitis   . Allergic rhinitis   . Asthma    Mild intermittent  . CAD (coronary artery disease)    MI 2008 (cardio Dr. Caprice KluverAl Little), catheterization showed a small ramus branch occluded, nonQ wave MI, ejection fraction 65%, occurred after using Imitrex for migraine, PTCA unsuccessful  . Dyslipidemia   . Hypercholesteremia   . Migraine    followed by neurologist Dr.Lewit  . Obesity   . Sleep apnea     Past Surgical History:  Procedure Laterality Date  . CARDIAC CATHETERIZATION     Showed a small ramus branch occluded,nonQ wave MI, ejection fraction 65% occured after using Imitrex for Migraine,PTCA unsuccessful  . NASAL SINUS SURGERY      Current Medications: Current Meds  Medication Sig  . albuterol (PROAIR HFA) 108 (90 BASE) MCG/ACT inhaler Inhale 1-2 puffs into the lungs every 6 (six) hours as needed for wheezing or shortness of breath.   Marland Kitchen. aspirin 81 MG tablet Take 81 mg by mouth daily.  . metoprolol succinate (TOPROL-XL) 25 MG 24 hr tablet TAKE 1 TABLET(25 MG) BY MOUTH DAILY  . niacin 500 MG tablet  Take 1,500 mg by mouth at bedtime.   . Omega-3 Fatty Acids (FISH OIL) 1000 MG CAPS Take 1 capsule by mouth daily.  . simvastatin (ZOCOR) 40 MG tablet TAKE 1 TABLET(40 MG) BY MOUTH DAILY  . triamcinolone (NASACORT AQ) 55 MCG/ACT AERO nasal inhaler Place 1 spray into the nose daily.     Allergies:   Imitrex [sumatriptan]   Social History   Socioeconomic History  . Marital status: Single    Spouse name: None  . Number of children: None  . Years of education: None  . Highest education level: None  Social Needs  . Financial resource strain: None  . Food insecurity - worry: None  . Food insecurity - inability: None  . Transportation needs - medical: None  . Transportation needs - non-medical: None  Occupational History  . None  Tobacco Use  . Smoking status: Never Smoker  . Smokeless tobacco: Never Used  Substance and Sexual Activity  . Alcohol use: Yes    Comment: 2-4 a week  . Drug use: No  . Sexual activity: None  Other Topics Concern  . None  Social History Narrative  . None     Family History: The patient's family history includes Diabetes in his father.  ROS:   Please see the history of present  illness.    ROS  All other systems reviewed and negative.   EKGs/Labs/Other Studies Reviewed:    The following studies were reviewed today: CPAP download  EKG:  EKG is not ordered today.   Recent Labs: No results found for requested labs within last 8760 hours.   Recent Lipid Panel No results found for: CHOL, TRIG, HDL, CHOLHDL, VLDL, LDLCALC, LDLDIRECT  Physical Exam:    VS:  BP 130/78   Pulse 68   Ht 5\' 8"  (1.727 m)   Wt 243 lb 12.8 oz (110.6 kg)   BMI 37.07 kg/m     Wt Readings from Last 3 Encounters:  02/20/17 243 lb 12.8 oz (110.6 kg)  04/03/16 237 lb (107.5 kg)  03/24/16 237 lb (107.5 kg)     GEN:  Well nourished, well developed in no acute distress HEENT: Normal NECK: No JVD; No carotid bruits LYMPHATICS: No lymphadenopathy CARDIAC: RRR, no  murmurs, rubs, gallops RESPIRATORY:  Clear to auscultation without rales, wheezing or rhonchi  ABDOMEN: Soft, non-tender, non-distended MUSCULOSKELETAL:  No edema; No deformity  SKIN: Warm and dry NEUROLOGIC:  Alert and oriented x 3 PSYCHIATRIC:  Normal affect   ASSESSMENT:    1. OSA (obstructive sleep apnea)   2. Essential hypertension   3. Obesity (BMI 30-39.9)    PLAN:    In order of problems listed above:  1.  OSA - the patient is tolerating PAP therapy well without any problems. The PAP download was reviewed today and showed an AHI of 1/hr on 11/7 cm H2O with 100% compliance in using more than 4 hours nightly.  The patient has been using and benefiting from CPAP use and will continue to benefit from therapy.  Due to insurance changes he no longer has a DME so I will set him up with [facility] and order CPAP supplies.  2.  HTN - BP is well controlled on exam today.  He will continue on Toprol XL 25mg  daily  3.  Obesity - I have encouraged him to get into a routine exercise program and cut back on carbs and portions.    Medication Adjustments/Labs and Tests Ordered: Current medicines are reviewed at length with the patient today.  Concerns regarding medicines are outlined above.  No orders of the defined types were placed in this encounter.  No orders of the defined types were placed in this encounter.   Signed, Armanda Magic, MD  02/20/2017 10:31 AM    [facility]

## 2017-02-20 ENCOUNTER — Encounter: Payer: Self-pay | Admitting: Cardiology

## 2017-02-20 ENCOUNTER — Telehealth: Payer: Self-pay | Admitting: *Deleted

## 2017-02-20 ENCOUNTER — Encounter (INDEPENDENT_AMBULATORY_CARE_PROVIDER_SITE_OTHER): Payer: Self-pay

## 2017-02-20 ENCOUNTER — Ambulatory Visit (INDEPENDENT_AMBULATORY_CARE_PROVIDER_SITE_OTHER): Payer: BLUE CROSS/BLUE SHIELD | Admitting: Cardiology

## 2017-02-20 VITALS — BP 130/78 | HR 68 | Ht 68.0 in | Wt 243.8 lb

## 2017-02-20 DIAGNOSIS — G4733 Obstructive sleep apnea (adult) (pediatric): Secondary | ICD-10-CM

## 2017-02-20 DIAGNOSIS — I10 Essential (primary) hypertension: Secondary | ICD-10-CM

## 2017-02-20 DIAGNOSIS — E669 Obesity, unspecified: Secondary | ICD-10-CM | POA: Diagnosis not present

## 2017-02-20 NOTE — Patient Instructions (Signed)

## 2017-02-20 NOTE — Telephone Encounter (Signed)
-----   Message from Phineas Semenonnisha Robertson, RN sent at 02/20/2017 10:51 AM EST ----- Regarding: DME order DME order for BiPAP supplies [facility]   Thanks  Sara Leeena

## 2017-02-20 NOTE — Addendum Note (Signed)
Addended by: Phineas SemenOBERTSON, Lanell Dubie on: 02/20/2017 10:51 AM   Modules accepted: Orders

## 2017-02-23 DIAGNOSIS — Z1211 Encounter for screening for malignant neoplasm of colon: Secondary | ICD-10-CM | POA: Diagnosis not present

## 2017-02-23 DIAGNOSIS — Z01818 Encounter for other preprocedural examination: Secondary | ICD-10-CM | POA: Diagnosis not present

## 2017-03-08 DIAGNOSIS — G4733 Obstructive sleep apnea (adult) (pediatric): Secondary | ICD-10-CM | POA: Diagnosis not present

## 2017-03-12 DIAGNOSIS — Z125 Encounter for screening for malignant neoplasm of prostate: Secondary | ICD-10-CM | POA: Diagnosis not present

## 2017-03-12 DIAGNOSIS — E782 Mixed hyperlipidemia: Secondary | ICD-10-CM | POA: Diagnosis not present

## 2017-03-12 DIAGNOSIS — Z0000 Encounter for general adult medical examination without abnormal findings: Secondary | ICD-10-CM | POA: Diagnosis not present

## 2017-03-15 DIAGNOSIS — Z0000 Encounter for general adult medical examination without abnormal findings: Secondary | ICD-10-CM | POA: Diagnosis not present

## 2017-03-26 DIAGNOSIS — D122 Benign neoplasm of ascending colon: Secondary | ICD-10-CM | POA: Diagnosis not present

## 2017-03-26 DIAGNOSIS — K5730 Diverticulosis of large intestine without perforation or abscess without bleeding: Secondary | ICD-10-CM | POA: Diagnosis not present

## 2017-03-26 DIAGNOSIS — Z1211 Encounter for screening for malignant neoplasm of colon: Secondary | ICD-10-CM | POA: Diagnosis not present

## 2017-03-26 NOTE — Telephone Encounter (Signed)
[facility] and [facility]

## 2017-03-28 ENCOUNTER — Ambulatory Visit (INDEPENDENT_AMBULATORY_CARE_PROVIDER_SITE_OTHER): Payer: BLUE CROSS/BLUE SHIELD | Admitting: Cardiology

## 2017-03-28 ENCOUNTER — Encounter: Payer: Self-pay | Admitting: Cardiology

## 2017-03-28 ENCOUNTER — Encounter (INDEPENDENT_AMBULATORY_CARE_PROVIDER_SITE_OTHER): Payer: Self-pay

## 2017-03-28 VITALS — BP 112/68 | HR 65 | Ht 68.0 in | Wt 232.8 lb

## 2017-03-28 DIAGNOSIS — E7800 Pure hypercholesterolemia, unspecified: Secondary | ICD-10-CM | POA: Diagnosis not present

## 2017-03-28 DIAGNOSIS — D122 Benign neoplasm of ascending colon: Secondary | ICD-10-CM | POA: Diagnosis not present

## 2017-03-28 DIAGNOSIS — E782 Mixed hyperlipidemia: Secondary | ICD-10-CM | POA: Diagnosis not present

## 2017-03-28 DIAGNOSIS — I2510 Atherosclerotic heart disease of native coronary artery without angina pectoris: Secondary | ICD-10-CM | POA: Diagnosis not present

## 2017-03-28 DIAGNOSIS — I2583 Coronary atherosclerosis due to lipid rich plaque: Secondary | ICD-10-CM

## 2017-03-28 MED ORDER — ATORVASTATIN CALCIUM 40 MG PO TABS: 40.0000 mg | ORAL_TABLET | Freq: Every day | ORAL | 3 refills | Status: DC

## 2017-03-28 NOTE — Progress Notes (Signed)
[address] [place], [place]  [address] Phone: [phone] Fax:  [phone]  Date:  03/28/2017   ID:  Christopher Mcpherson, DOB [birth year], MRN [id number]  PCP:  Catha GosselinLittle, Kevin, MD   History of Present Illness: Christopher Mcpherson is a 62 y.o. male with coronary artery disease, former patient of Dr. Caprice KluverAl Little with history of myocardial infarction in 2008 where catheterization showed a small ramus branch that was occluded, EF 65%.   Myocardial infarction occurred after using Imitrex for her migraine headache. Percutaneous intervention to the small branch was unsuccessful.   Obstructive sleep apnea is also being followed by Dr. Mayford Knifeurner and he is treated with BiPAP.   Simvastatin, in the past,  has been very successful at reducing his LDL cholesterol. He was off of simvastatin for 2 months and his LDL went up to 199.  However, recently his LDL is in the 110's.   He has been taking metoprolol once a day as well. overall, no symptoms, no syncope, no chest pain, dyspnea with exertion.   His EKG with biphasic T-wave in V3 and T-wave inversions in anterolateral leads, NUC stress OK  Prior LDL 113 taking maximum dose of simvastatin.  Consideration of Crestor noted.  No CP, no SOB, no F/C/N/V. No syncope.   Wt Readings from Last 3 Encounters:  03/28/17 232 lb 12.8 oz (105.6 kg)  02/20/17 243 lb 12.8 oz (110.6 kg)  04/03/16 237 lb (107.5 kg)     Past Medical History:  Diagnosis Date  . Acute myocardial infarction, subendocardial infarction, subsequent episode of care (HCC)   . Allergic rhinitis   . Allergic rhinitis   . Asthma    Mild intermittent  . CAD (coronary artery disease)    MI 2008 (cardio Dr. Caprice KluverAl Little), catheterization showed a small ramus branch occluded, nonQ wave MI, ejection fraction 65%, occurred after using Imitrex for migraine, PTCA unsuccessful  . Dyslipidemia   . Hypercholesteremia   . Migraine    followed by neurologist Dr.Lewit  . Obesity     . Sleep apnea     Past Surgical History:  Procedure Laterality Date  . CARDIAC CATHETERIZATION     Showed a small ramus branch occluded,nonQ wave MI, ejection fraction 65% occured after using Imitrex for Migraine,PTCA unsuccessful  . NASAL SINUS SURGERY      Current Outpatient Medications  Medication Sig Dispense Refill  . albuterol (PROAIR HFA) 108 (90 BASE) MCG/ACT inhaler Inhale 1-2 puffs into the lungs every 6 (six) hours as needed for wheezing or shortness of breath.     Marland Kitchen. aspirin 81 MG tablet Take 81 mg by mouth daily.    . metoprolol succinate (TOPROL-XL) 25 MG 24 hr tablet TAKE 1 TABLET(25 MG) BY MOUTH DAILY 90 tablet 3  . niacin 500 MG tablet Take 1,500 mg by mouth at bedtime.     . Omega-3 Fatty Acids (FISH OIL) 1000 MG CAPS Take 1 capsule by mouth daily.    . simvastatin (ZOCOR) 40 MG tablet TAKE 1 TABLET(40 MG) BY MOUTH DAILY 90 tablet 3  . triamcinolone (NASACORT AQ) 55 MCG/ACT AERO nasal inhaler Place 1 spray into the nose daily.     No current facility-administered medications for this visit.     Allergies:    Allergies  Allergen Reactions  . Imitrex [Sumatriptan] Other (See Comments)    Myocardial infarction    Social History:  The patient  reports that  has never smoked.  he has never used smokeless tobacco. He reports that he drinks alcohol. He reports that he does not use drugs.   ROS:  Please see the history of present illness.   Denies any fevers, chills, orthopnea, PND    PHYSICAL EXAM: VS:  BP 112/68   Pulse 65   Ht 5\' 8"  (1.727 m)   Wt 232 lb 12.8 oz (105.6 kg)   BMI 35.40 kg/m  GEN: Well nourished, well developed, in no acute distress, obese HEENT: normal  Neck: no JVD, carotid bruits, or masses Cardiac: RRR; no murmurs, rubs, or gallops,no edema  Respiratory:  clear to auscultation bilaterally, normal work of breathing GI: soft, nontender, nondistended, + BS MS: no deformity or atrophy  Skin: warm and dry, no rash Neuro:  Alert and Oriented  x 3, Strength and sensation are intact Psych: euthymic mood, full affect   EKG:  Today 2/913/19 - NSR, TWI 1, L, no change, prior 03/24/16-sinus rhythm 64 with T-wave inversion in the anterolateral leads, biphasic in V3 personally viewed-no significant change in prior however 2015 does look different. T-wave inversion also noted in 1 and aVL. 02/19/15-sinus rhythm, 66, nonspecific T-wave changes, slightly biphasic in V4 through V6. Personally viewed-no prior change from Sinus rhythm, 61, T wave inversion, mild aVL, borderline LVH.     NUC stress 04/03/16 Overall low risk study, no ischemia. Reassuring. Normal EF. T-wave inversion normalized during stress.  Labs: LDL 113  ASSESSMENT AND PLAN:  1. Coronary artery disease - stable. No active anginal symptoms. However, he does have biphasic T-wave in V3 with T-wave inversion fairly diffusely. Nuclear stress test 04/03/16 was reassuring.  Back in 2015, his EKG did not demonstrate all of these T-wave inversions. Continue with aggressive secondary prevention. 2. Hyperlipidemia-LDL was 87, now 199 on 01/02/15. He admits that he was off of his simvastatin approximate 2 months prior to this high LDL value. He is now back on. Dr. Clarene Duke will be rechecking. LDL goal should be 70 or at least 50% reduction. Continue for now with current medications. Diet, exercise. He has also been taking fish oil as well as niacin. 3. Morbid Obesity-(BMI >35 with CAD, HTN, OSA) continue to encourage weight loss. We discussed low carbohydrates. 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 Fruits and veg.  4. Hypertension-normal today. Continue with current medications. Well controlled.  5. OSA - Dr. Mayford Knife. He wears this nightly. Doing well.  6. Old myocardial infarction - 2008, ramus branch, no PCI. Continue with aggressive medical management. 7. Follow up in one year.  Signed, Donato Schultz, MD [facility]  03/28/2017 2:32 PM

## 2017-03-28 NOTE — Patient Instructions (Signed)
Medication Instructions:  1) DISCONTINUE Simvastatin 2) START Atorvastatin 40mg  once daily  Labwork: Your physician recommends that you return for lab work in: 3 months (Lipid, ALT).  Please come to this appointment fasting (nothing to eat or drink after midnight).   Testing/Procedures: None  Follow-Up: Your physician wants you to follow-up in: 1 year with Dr. Anne FuSkains.  You will receive a reminder letter in the mail two months in advance. If you don't receive a letter, please call our office to schedule the follow-up appointment.   Any Other Special Instructions Will Be Listed Below (If Applicable).     If you need a refill on your cardiac medications before your next appointment, please call your pharmacy.

## 2017-04-01 ENCOUNTER — Other Ambulatory Visit: Payer: Self-pay | Admitting: Cardiology

## 2017-06-27 ENCOUNTER — Encounter (INDEPENDENT_AMBULATORY_CARE_PROVIDER_SITE_OTHER): Payer: Self-pay

## 2017-06-27 ENCOUNTER — Other Ambulatory Visit: Payer: BLUE CROSS/BLUE SHIELD | Admitting: *Deleted

## 2017-06-27 DIAGNOSIS — E7800 Pure hypercholesterolemia, unspecified: Secondary | ICD-10-CM

## 2017-06-27 LAB — LIPID PANEL
CHOLESTEROL TOTAL: 150 mg/dL (ref 100–199)
Chol/HDL Ratio: 3.9 ratio (ref 0.0–5.0)
HDL: 38 mg/dL — AB (ref >39)
LDL Calculated: 95 mg/dL (ref 0–99)
Triglycerides: 87 mg/dL (ref 0–149)
VLDL Cholesterol Cal: 17 mg/dL (ref 5–40)

## 2017-06-27 LAB — ALT: ALT: 18 IU/L (ref 0–44)

## 2017-07-06 ENCOUNTER — Other Ambulatory Visit: Payer: Self-pay | Admitting: *Deleted

## 2017-07-06 DIAGNOSIS — E7800 Pure hypercholesterolemia, unspecified: Secondary | ICD-10-CM

## 2017-07-06 DIAGNOSIS — Z79899 Other long term (current) drug therapy: Secondary | ICD-10-CM

## 2017-07-06 MED ORDER — ATORVASTATIN CALCIUM 80 MG PO TABS: 80.0000 mg | ORAL_TABLET | Freq: Every day | ORAL | Status: DC

## 2017-08-14 ENCOUNTER — Other Ambulatory Visit: Payer: BLUE CROSS/BLUE SHIELD | Admitting: *Deleted

## 2017-08-14 DIAGNOSIS — E7800 Pure hypercholesterolemia, unspecified: Secondary | ICD-10-CM | POA: Diagnosis not present

## 2017-08-14 DIAGNOSIS — Z79899 Other long term (current) drug therapy: Secondary | ICD-10-CM | POA: Diagnosis not present

## 2017-08-14 LAB — LIPID PANEL
CHOLESTEROL TOTAL: 122 mg/dL (ref 100–199)
Chol/HDL Ratio: 3.1 ratio (ref 0.0–5.0)
HDL: 39 mg/dL — ABNORMAL LOW (ref >39)
LDL Calculated: 66 mg/dL (ref 0–99)
Triglycerides: 86 mg/dL (ref 0–149)
VLDL CHOLESTEROL CAL: 17 mg/dL (ref 5–40)

## 2017-08-14 LAB — ALT: ALT: 20 IU/L (ref 0–44)

## 2017-08-15 ENCOUNTER — Other Ambulatory Visit: Payer: Self-pay | Admitting: *Deleted

## 2017-08-15 MED ORDER — ATORVASTATIN CALCIUM 80 MG PO TABS: 80.0000 mg | ORAL_TABLET | Freq: Every day | ORAL | 1 refills | Status: DC

## 2017-10-11 ENCOUNTER — Other Ambulatory Visit: Payer: BLUE CROSS/BLUE SHIELD

## 2018-02-15 ENCOUNTER — Other Ambulatory Visit: Payer: Self-pay | Admitting: Cardiology

## 2018-03-19 ENCOUNTER — Other Ambulatory Visit: Payer: Self-pay | Admitting: *Deleted

## 2018-03-19 ENCOUNTER — Telehealth: Payer: Self-pay | Admitting: Cardiology

## 2018-03-19 MED ORDER — METOPROLOL SUCCINATE ER 25 MG PO TB24: 25.0000 mg | ORAL_TABLET | Freq: Every day | ORAL | 0 refills | Status: DC

## 2018-03-19 MED ORDER — ATORVASTATIN CALCIUM 80 MG PO TABS: 80.0000 mg | ORAL_TABLET | Freq: Every day | ORAL | 0 refills | Status: DC

## 2018-03-19 NOTE — Telephone Encounter (Signed)
° ° ° °*  STAT* If patient is at the pharmacy, call can be transferred to refill team.   1. Which medications need to be refilled? (please list name of each medication and dose if known) atorvastatin (LIPITOR) 80 MG tablet, and metoprolol succinate (TOPROL-XL) 25 MG 24 hr tablet  2. Which pharmacy/location (including street and city if local pharmacy) is medication to be sent to? [id number] [place], [place] - [address]  3. Do they need a 30 day or 90 day supply? 90

## 2018-03-21 DIAGNOSIS — E782 Mixed hyperlipidemia: Secondary | ICD-10-CM | POA: Diagnosis not present

## 2018-03-21 DIAGNOSIS — I2510 Atherosclerotic heart disease of native coronary artery without angina pectoris: Secondary | ICD-10-CM | POA: Diagnosis not present

## 2018-03-21 DIAGNOSIS — G4730 Sleep apnea, unspecified: Secondary | ICD-10-CM | POA: Diagnosis not present

## 2018-03-21 DIAGNOSIS — Z0000 Encounter for general adult medical examination without abnormal findings: Secondary | ICD-10-CM | POA: Diagnosis not present

## 2018-03-21 DIAGNOSIS — Z23 Encounter for immunization: Secondary | ICD-10-CM | POA: Diagnosis not present

## 2018-03-31 ENCOUNTER — Other Ambulatory Visit: Payer: Self-pay | Admitting: Cardiology

## 2018-05-02 ENCOUNTER — Telehealth: Payer: Self-pay

## 2018-05-02 NOTE — Telephone Encounter (Signed)
Pt called back and rescheduled to 5/18

## 2018-05-02 NOTE — Telephone Encounter (Signed)
LMTCB 3/19  need to postpone appt d/t COVID19

## 2018-05-09 ENCOUNTER — Ambulatory Visit: Payer: BLUE CROSS/BLUE SHIELD | Admitting: Cardiology

## 2018-06-21 ENCOUNTER — Other Ambulatory Visit: Payer: Self-pay | Admitting: Cardiology

## 2018-06-21 MED ORDER — ATORVASTATIN CALCIUM 80 MG PO TABS: 80.0000 mg | ORAL_TABLET | Freq: Every day | ORAL | 0 refills | Status: DC

## 2018-06-21 NOTE — Telephone Encounter (Signed)
Pt's medication was sent to pt's pharmacy as requested. Confirmation received.  °

## 2018-07-01 ENCOUNTER — Ambulatory Visit: Payer: BLUE CROSS/BLUE SHIELD | Admitting: Cardiology

## 2018-07-04 ENCOUNTER — Other Ambulatory Visit: Payer: Self-pay | Admitting: Cardiology

## 2018-09-23 ENCOUNTER — Other Ambulatory Visit: Payer: Self-pay | Admitting: Cardiology

## 2018-09-25 ENCOUNTER — Other Ambulatory Visit: Payer: Self-pay | Admitting: Cardiology

## 2018-10-16 ENCOUNTER — Ambulatory Visit: Payer: BLUE CROSS/BLUE SHIELD | Admitting: Cardiology

## 2018-10-26 ENCOUNTER — Other Ambulatory Visit: Payer: Self-pay | Admitting: Cardiology

## 2018-10-28 ENCOUNTER — Other Ambulatory Visit: Payer: Self-pay | Admitting: Cardiology

## 2018-11-12 NOTE — Progress Notes (Signed)
Cardiology Office Note   Date:  11/13/2018   ID:  Christopher Mcpherson, DOB [birth year], MRN [id number]  PCP:  Hulan Fess, MD  Cardiologist:  Dr. Marlou Porch    Chief Complaint  Patient presents with  . Coronary Artery Disease      History of Present Illness: Christopher Mcpherson is a 63 y.o. male who presents for CAD.   He has a hx of coronary artery disease, former patient of Dr. Aldona Bar with history of myocardial infarction in 2008 where catheterization showed a small ramus branch that was occluded, EF 65%.   Myocardial infarction occurred after using Imitrex for her migraine headache. Percutaneous intervention to the small branch was unsuccessful.   Obstructive sleep apnea is also being followed by Dr. Radford Pax and he is treated with BiPAP.   Simvastatin, in the past,  has been very successful at reducing his LDL cholesterol. He was off of simvastatin for 2 months and his LDL went up to 199.  However, recently his LDL is in the 110's.    His EKG with biphasic T-wave in V3 and T-wave inversions in anterolateral leads, NUC stress OK  Prior LDL 113 taking maximum dose of atorvastatin.  now LDL111 Today No CP, no SOB, feels well he walks 2 miles per day.  No new issues.    Lipids were elevated at 111, discussed goal of < 70.  Will add zetia.  EKG today is abnormal though similar to 2018 with neg stress test then.  He does eat healthy.  He is doing well with CPAP   Past Medical History:  Diagnosis Date  . Acute myocardial infarction, subendocardial infarction, subsequent episode of care ([place])   . Allergic rhinitis   . Allergic rhinitis   . Asthma    Mild intermittent  . CAD (coronary artery disease)    MI 2008 (cardio Dr. Aldona Bar), catheterization showed a small ramus branch occluded, nonQ wave MI, ejection fraction 65%, occurred after using Imitrex for migraine, PTCA unsuccessful  . Dyslipidemia   . Hypercholesteremia   . Migraine    followed by neurologist  Dr.Lewit  . Obesity   . Sleep apnea     Past Surgical History:  Procedure Laterality Date  . CARDIAC CATHETERIZATION     Showed a small ramus branch occluded,nonQ wave MI, ejection fraction 65% occured after using Imitrex for Migraine,PTCA unsuccessful  . NASAL SINUS SURGERY       Current Outpatient Medications  Medication Sig Dispense Refill  . albuterol (PROAIR HFA) 108 (90 BASE) MCG/ACT inhaler Inhale 1-2 puffs into the lungs every 6 (six) hours as needed for wheezing or shortness of breath.     Marland Kitchen aspirin 81 MG tablet Take 81 mg by mouth daily.    Marland Kitchen atorvastatin (LIPITOR) 80 MG tablet TAKE 1 TABLET BY MOUTH DAILY AT 6 PM 30 tablet 0  . metoprolol succinate (TOPROL-XL) 25 MG 24 hr tablet TAKE 1 TABLET( 25 MG TOTAL) BY MOUTH DAILY. PLEASE KEEP UPCOMING APPOINTMENT FOR FURTHER REFILLS. 90 tablet 1  . Omega-3 Fatty Acids (FISH OIL) 1000 MG CAPS Take 1 capsule by mouth daily.    Marland Kitchen omeprazole (PRILOSEC) 20 MG capsule Take 20 mg by mouth daily.    Marland Kitchen triamcinolone (NASACORT AQ) 55 MCG/ACT AERO nasal inhaler Place 1 spray into the nose daily.     No current facility-administered medications for this visit.     Allergies:   Imitrex [sumatriptan]    Social History:  The patient  reports that he has never smoked. He has never used smokeless tobacco. He reports current alcohol use. He reports that he does not use drugs.   Family History:  The patient's family history includes Diabetes in his father.    ROS:  General:no colds or fevers, no weight changes Skin:no rashes or ulcers HEENT:no blurred vision, no congestion CV:see HPI PUL:see HPI GI:no diarrhea constipation or melena, no indigestion GU:no hematuria, no dysuria MS:no joint pain, no claudication Neuro:no syncope, no lightheadedness Endo:no diabetes, no thyroid disease  Wt Readings from Last 3 Encounters:  11/13/18 242 lb (109.8 kg)  03/28/17 232 lb 12.8 oz (105.6 kg)  02/20/17 243 lb 12.8 oz (110.6 kg)     PHYSICAL  EXAM: VS:  BP 130/68   Pulse 66   Ht 5\' 8"  (1.727 m)   Wt 242 lb (109.8 kg)   SpO2 94%   BMI 36.80 kg/m  , BMI Body mass index is 36.8 kg/m. General:Pleasant affect, NAD Skin:Warm and dry, brisk capillary refill HEENT:normocephalic, sclera clear, mucus membranes moist Neck:supple, no JVD, no bruits  Heart:S1S2 RRR without murmur, gallup, rub or click Lungs:clear without rales, rhonchi, or wheezes , non tender, + BS, do not palpate liver spleen or masses Ext:no lower ext edema, 2+ pedal pulses, 2+ radial pulses Neuro:alert and oriented, MAE, follows commands, + facial symmetry    EKG:  EKG is ordered today. The ekg ordered today demonstrates SR ST abnormality with Biphasic T wave in V3-V6 all similar to EKG  03/24/16.    Recent Labs: No results found for requested labs within last 8760 hours.    Lipid Panel    Component Value Date/Time   CHOL 122 08/14/2017 0822   TRIG 86 08/14/2017 0822   HDL 39 (L) 08/14/2017 0822   CHOLHDL 3.1 08/14/2017 0822   LDLCALC 66 08/14/2017 0822       Other studies Reviewed: Additional studies/ records that were reviewed today include: . NUC stress 04/03/16 Overall low risk study, no ischemia. Reassuring. Normal EF. T-wave inversion normalized during stress.  ASSESSMENT AND PLAN:  1.  CAD with hx of MI with occluded small ramus branch. No angina, neg nuc in 2018  Mi after Imitrex with migraine.  Follow up with Dr 2019 in 1 year unless echo is abnormal.  2.  Abnormal EKG concern for LVH with no pain.  Discussed with Dr. Anne Fu and will do echocardiogram.  May need cardiac MRI depending on echo.  3.  OSA followed by Dr. Anne Fu on BiPAP   4.  HLD on lipitor and elevated LDL at 111 will add zetia and repeat lipids in 6 weeks.  5.  Flu vaccine given.  Current medicines are reviewed with the patient today.  The patient Has no concerns regarding medicines.  The following changes have been made:  See above Labs/ tests ordered  today include:see above  Disposition:   FU:  see above  Signed, Mayford Knife, NP  11/13/2018 9:11 AM    [facility] [address] [place], [place], [place]  [place] [address] [address] [place], [place] Phone: [phone]; Fax: [phone]  [phone]

## 2018-11-13 ENCOUNTER — Encounter: Payer: Self-pay | Admitting: Cardiology

## 2018-11-13 ENCOUNTER — Ambulatory Visit (INDEPENDENT_AMBULATORY_CARE_PROVIDER_SITE_OTHER): Payer: BC Managed Care – PPO | Admitting: Cardiology

## 2018-11-13 ENCOUNTER — Encounter (INDEPENDENT_AMBULATORY_CARE_PROVIDER_SITE_OTHER): Payer: Self-pay

## 2018-11-13 ENCOUNTER — Other Ambulatory Visit: Payer: Self-pay

## 2018-11-13 VITALS — BP 130/68 | HR 66 | Ht 68.0 in | Wt 242.0 lb

## 2018-11-13 DIAGNOSIS — E782 Mixed hyperlipidemia: Secondary | ICD-10-CM

## 2018-11-13 DIAGNOSIS — R9431 Abnormal electrocardiogram [ECG] [EKG]: Secondary | ICD-10-CM

## 2018-11-13 DIAGNOSIS — I2583 Coronary atherosclerosis due to lipid rich plaque: Secondary | ICD-10-CM

## 2018-11-13 DIAGNOSIS — I10 Essential (primary) hypertension: Secondary | ICD-10-CM

## 2018-11-13 DIAGNOSIS — I2510 Atherosclerotic heart disease of native coronary artery without angina pectoris: Secondary | ICD-10-CM

## 2018-11-13 DIAGNOSIS — Z23 Encounter for immunization: Secondary | ICD-10-CM | POA: Diagnosis not present

## 2018-11-13 MED ORDER — EZETIMIBE 10 MG PO TABS: 10.0000 mg | ORAL_TABLET | Freq: Every day | ORAL | 3 refills | Status: DC

## 2018-11-13 NOTE — Patient Instructions (Addendum)
Medication Instructions:  Your physician has recommended you make the following change in your medication:  1.  START Zetia 10 mg daily   If you need a refill on your cardiac medications before your next appointment, please call your pharmacy.   Lab work: 12/25/2018:  8:00 COME FOR FASTING LIPID/HEPATIC FUNCTION TEST. NOTHING TO EAT OR DRINK AFTER MIDNIGHT THE NIGHT BEFORE   If you have labs (blood work) drawn today and your tests are completely normal, you will receive your results only by: Marland Kitchen MyChart Message (if you have MyChart) OR . A paper copy in the mail If you have any lab test that is abnormal or we need to change your treatment, we will call you to review the results.  Testing/Procedures: Your physician has requested that you have an echocardiogram 11/19/2018 ARRIVE AT 8:05 FOR REGISTRATION.  Echocardiography is a painless test that uses sound waves to create images of your heart. It provides your doctor with information about the size and shape of your heart and how well your heart's chambers and valves are working. This procedure takes approximately one hour. There are no restrictions for this procedure.   Follow-Up: At [facility], you and your health needs are our priority.  As part of our continuing mission to provide you with exceptional heart care, we have created designated Provider Care Teams.  These Care Teams include your primary Cardiologist (physician) and Advanced Practice Providers (APPs -  Physician Assistants and Nurse Practitioners) who all work together to provide you with the care you need, when you need it. You will need a follow up appointment in 12 months.  Please call our office 2 months in advance to schedule this appointment.  You may see Candee Furbish, MD or one of the following Advanced Practice Providers on your designated Care Team:   Truitt Merle, NP Cecilie Kicks, NP . Kathyrn Drown, NP  Any Other Special Instructions Will Be Listed Below (If  Applicable).  Echocardiogram An echocardiogram is a procedure that uses painless sound waves (ultrasound) to produce an image of the heart. Images from an echocardiogram can provide important information about:  Signs of coronary artery disease (CAD).  Aneurysm detection. An aneurysm is a weak or damaged part of an artery wall that bulges out from the normal force of blood pumping through the body.  Heart size and shape. Changes in the size or shape of the heart can be associated with certain conditions, including heart failure, aneurysm, and CAD.  Heart muscle function.  Heart valve function.  Signs of a past heart attack.  Fluid buildup around the heart.  Thickening of the heart muscle.  A tumor or infectious growth around the heart valves. Tell a health care provider about:  Any allergies you have.  All medicines you are taking, including vitamins, herbs, eye drops, creams, and over-the-counter medicines.  Any blood disorders you have.  Any surgeries you have had.  Any medical conditions you have.  Whether you are pregnant or may be pregnant. What are the risks? Generally, this is a safe procedure. However, problems may occur, including:  Allergic reaction to dye (contrast) that may be used during the procedure. What happens before the procedure? No specific preparation is needed. You may eat and drink normally. What happens during the procedure?   An IV tube may be inserted into one of your veins.  You may receive contrast through this tube. A contrast is an injection that improves the quality of the pictures from your  heart.  A gel will be applied to your chest.  A wand-like tool (transducer) will be moved over your chest. The gel will help to transmit the sound waves from the transducer.  The sound waves will harmlessly bounce off of your heart to allow the heart images to be captured in real-time motion. The images will be recorded on a computer. The  procedure may vary among health care providers and hospitals. What happens after the procedure?  You may return to your normal, everyday life, including diet, activities, and medicines, unless your health care provider tells you not to do that. Summary  An echocardiogram is a procedure that uses painless sound waves (ultrasound) to produce an image of the heart.  Images from an echocardiogram can provide important information about the size and shape of your heart, heart muscle function, heart valve function, and fluid buildup around your heart.  You do not need to do anything to prepare before this procedure. You may eat and drink normally.  After the echocardiogram is completed, you may return to your normal, everyday life, unless your health care provider tells you not to do that. This information is not intended to replace advice given to you by your health care provider. Make sure you discuss any questions you have with your health care provider. Document Released: 01/28/2000 Document Revised: 05/23/2018 Document Reviewed: 03/04/2016 Elsevier Patient Education  2020 [employer].

## 2018-11-19 ENCOUNTER — Encounter (HOSPITAL_COMMUNITY): Payer: Self-pay | Admitting: Cardiology

## 2018-11-19 ENCOUNTER — Other Ambulatory Visit (HOSPITAL_COMMUNITY): Payer: BC Managed Care – PPO

## 2018-11-26 ENCOUNTER — Telehealth (HOSPITAL_COMMUNITY): Payer: Self-pay

## 2018-11-26 NOTE — Telephone Encounter (Signed)
New message    Just an FYI. We have made several attempts to contact this patient including sending a letter to schedule or reschedule their echocardiogram. We will be removing the patient from the echo WQ.   10.12.20 @ 9:55am lm on home vm  Christopher Mcpherson  10.9.20 @ 10:00am lm on home vm - Christopher Mcpherson  10.6.20 mail reminder letter Christopher Mcpherson  10.6.20 no show

## 2018-11-27 NOTE — Telephone Encounter (Signed)
Noted! Thank you

## 2018-11-30 ENCOUNTER — Other Ambulatory Visit: Payer: Self-pay | Admitting: Cardiology

## 2018-12-26 ENCOUNTER — Other Ambulatory Visit: Payer: BC Managed Care – PPO

## 2018-12-31 ENCOUNTER — Other Ambulatory Visit: Payer: Self-pay | Admitting: Cardiology

## 2019-02-09 DIAGNOSIS — Z20828 Contact with and (suspected) exposure to other viral communicable diseases: Secondary | ICD-10-CM | POA: Diagnosis not present

## 2019-02-10 DIAGNOSIS — R9431 Abnormal electrocardiogram [ECG] [EKG]: Secondary | ICD-10-CM | POA: Diagnosis not present

## 2019-02-10 DIAGNOSIS — I493 Ventricular premature depolarization: Secondary | ICD-10-CM | POA: Diagnosis not present

## 2019-02-10 DIAGNOSIS — I252 Old myocardial infarction: Secondary | ICD-10-CM | POA: Diagnosis not present

## 2019-02-10 DIAGNOSIS — E785 Hyperlipidemia, unspecified: Secondary | ICD-10-CM | POA: Diagnosis not present

## 2019-03-05 DIAGNOSIS — Z20828 Contact with and (suspected) exposure to other viral communicable diseases: Secondary | ICD-10-CM | POA: Diagnosis not present

## 2019-03-07 DIAGNOSIS — Z20828 Contact with and (suspected) exposure to other viral communicable diseases: Secondary | ICD-10-CM | POA: Diagnosis not present

## 2019-03-11 DIAGNOSIS — Z20828 Contact with and (suspected) exposure to other viral communicable diseases: Secondary | ICD-10-CM | POA: Diagnosis not present

## 2019-03-17 DIAGNOSIS — Z20828 Contact with and (suspected) exposure to other viral communicable diseases: Secondary | ICD-10-CM | POA: Diagnosis not present

## 2019-03-21 DIAGNOSIS — E782 Mixed hyperlipidemia: Secondary | ICD-10-CM | POA: Diagnosis not present

## 2019-03-21 DIAGNOSIS — Z125 Encounter for screening for malignant neoplasm of prostate: Secondary | ICD-10-CM | POA: Diagnosis not present

## 2019-03-21 DIAGNOSIS — Z79899 Other long term (current) drug therapy: Secondary | ICD-10-CM | POA: Diagnosis not present

## 2019-03-27 DIAGNOSIS — G4733 Obstructive sleep apnea (adult) (pediatric): Secondary | ICD-10-CM | POA: Diagnosis not present

## 2019-03-27 DIAGNOSIS — J4520 Mild intermittent asthma, uncomplicated: Secondary | ICD-10-CM | POA: Diagnosis not present

## 2019-03-27 DIAGNOSIS — I2510 Atherosclerotic heart disease of native coronary artery without angina pectoris: Secondary | ICD-10-CM | POA: Diagnosis not present

## 2019-03-27 DIAGNOSIS — E782 Mixed hyperlipidemia: Secondary | ICD-10-CM | POA: Diagnosis not present

## 2019-04-16 DIAGNOSIS — Z23 Encounter for immunization: Secondary | ICD-10-CM | POA: Diagnosis not present

## 2019-05-23 DIAGNOSIS — Z0000 Encounter for general adult medical examination without abnormal findings: Secondary | ICD-10-CM | POA: Diagnosis not present

## 2019-07-17 DIAGNOSIS — E785 Hyperlipidemia, unspecified: Secondary | ICD-10-CM | POA: Diagnosis not present

## 2019-07-17 DIAGNOSIS — I252 Old myocardial infarction: Secondary | ICD-10-CM | POA: Diagnosis not present

## 2019-07-17 DIAGNOSIS — I2510 Atherosclerotic heart disease of native coronary artery without angina pectoris: Secondary | ICD-10-CM | POA: Diagnosis not present

## 2019-07-17 DIAGNOSIS — R9431 Abnormal electrocardiogram [ECG] [EKG]: Secondary | ICD-10-CM | POA: Diagnosis not present

## 2019-08-07 ENCOUNTER — Telehealth: Payer: Self-pay | Admitting: *Deleted

## 2019-08-07 DIAGNOSIS — Z23 Encounter for immunization: Secondary | ICD-10-CM | POA: Diagnosis not present

## 2019-08-07 NOTE — Telephone Encounter (Signed)
-----   Message from Quintella Reichert, MD sent at 08/07/2019  5:39 PM EDT ----- Regarding: RE: Monday 08/11/19 D/L GOod AHI and compliance ----- Message ----- From: Reesa Chew, CMA Sent: 08/07/2019   3:53 PM EDT To: Quintella Reichert, MD Subject: Monday 08/11/19 D/L                             [place], [place] 05/09/2019 - 08/06/2019 Patient ID: [id number] DOB: 06/27/1977 Age: 64 years 30.5 [place] (Therapist) [address] [place], [address] Compliance Report Usage 05/09/2019 - 08/06/2019 Usage days 90/90 days (100%) >= 4 hours 90 days (100%) < 4 hours 0 days (0%) Usage hours 728 hours 2 minutes Average usage (total days) 8 hours 5 minutes Average usage (days used) 8 hours 5 minutes Median usage (days used) 8 hours 8 minutes VPAP ST (S9) Serial number [id number] Mode Spont Timed IPAP 11 cmH2O EPAP 7 cmH2O Respiratory rate 12 bpm Therapy Leaks - L/min Median: 0.0 95th percentile: 4.9 Maximum: 17.4 Events per hour AI: 0.0 HI: 1.1 AHI: 1.1 Usage - hours Printed on 08/07/2019 - ResMed AirView version 4.26.0-2.0 Page 1 of 1

## 2019-08-07 NOTE — Telephone Encounter (Signed)
Informed patient of compliance results and verbalized understanding was indicated. Patient is aware and agreeable to AHI being within range at 1.1. Patient is aware and agreeable to being in compliance with machine usage Patient is aware and agreeable to no change in current pressures 

## 2019-08-10 NOTE — Progress Notes (Signed)
Virtual Visit via Telephone Note   This visit type was conducted due to national recommendations for restrictions regarding the COVID-19 Pandemic (e.g. social distancing) in an effort to limit this patient's exposure and mitigate transmission in our community.  Due to his co-morbid illnesses, this patient is at least at moderate risk for complications without adequate follow up.  This format is felt to be most appropriate for this patient at this time.  The patient did not have access to video technology/had technical difficulties with video requiring transitioning to audio format only (telephone).  All issues noted in this document were discussed and addressed.  No physical exam could be performed with this format.  Please refer to the patient's chart for his  consent to telehealth for [facility].  Evaluation Performed:  Follow-up visit  This visit type was conducted due to national recommendations for restrictions regarding the COVID-19 Pandemic (e.g. social distancing).  This format is felt to be most appropriate for this patient at this time.  All issues noted in this document were discussed and addressed.  No physical exam was performed (except for noted visual exam findings with Video Visits).  Please refer to the patient's chart (MyChart message for video visits and phone note for telephone visits) for the patient's consent to telehealth for [facility].  Date:  08/11/2019   ID:  Christopher Mcpherson, DOB 07-21-1955, MRN [id number]  Patient Location:  Home  Provider location:   [place]  PCP:  Catha Gosselin, MD  Cardiologist:  Donato Schultz, MD  Sleep Medicine:   Armanda Magic, MD Electrophysiologist:  None   Chief Complaint:  OSA  History of Present Illness:    Christopher Mcpherson is a 64 y.o. male who presents via audio/video conferencing for a telehealth visit today.    Christopher Mcpherson is a 63 y.o. male with a hx of OSA and is on BiPAP on 11/7cm H2O. He is doing well with  his PAP device and thinks that he has gotten used to it.  He tolerates the mask and feels the pressure is adequate.  Since going on PAP he feels rested in the am and has no significant daytime sleepiness.  He denies any significant mouth or nasal dryness or nasal congestion.  He does not think that he snores.  He thinks his device is getting old.    Prior CV studies:   The following studies were reviewed today:  None  Past Medical History:  Diagnosis Date  . Acute myocardial infarction, subendocardial infarction, subsequent episode of care ([facility])   . Allergic rhinitis   . Allergic rhinitis   . Asthma    Mild intermittent  . CAD (coronary artery disease)    MI 2008 (cardio Dr. Caprice Kluver), catheterization showed a small ramus branch occluded, nonQ wave MI, ejection fraction 65%, occurred after using Imitrex for migraine, PTCA unsuccessful  . Dyslipidemia   . Hypercholesteremia   . Migraine    followed by neurologist Dr.Lewit  . Obesity   . Sleep apnea    Past Surgical History:  Procedure Laterality Date  . CARDIAC CATHETERIZATION     Showed a small ramus branch occluded,nonQ wave MI, ejection fraction 65% occured after using Imitrex for Migraine,PTCA unsuccessful  . NASAL SINUS SURGERY       Current Meds  Medication Sig  . albuterol (PROAIR HFA) 108 (90 BASE) MCG/ACT inhaler Inhale 1-2 puffs into the lungs every 6 (six) hours as needed for wheezing or shortness of breath.   Marland Kitchen  aspirin 81 MG tablet Take 81 mg by mouth daily.  Marland Kitchen atorvastatin (LIPITOR) 80 MG tablet TAKE 1 TABLET BY MOUTH DAILY AT 6 PM  . ezetimibe (ZETIA) 10 MG tablet Take 1 tablet (10 mg total) by mouth daily.  . metoprolol succinate (TOPROL-XL) 25 MG 24 hr tablet TAKE 1 TABLET BY MOUTH DAILY  . Omega-3 Fatty Acids (FISH OIL) 1000 MG CAPS Take 1 capsule by mouth daily.  Marland Kitchen omeprazole (PRILOSEC) 20 MG capsule Take 20 mg by mouth daily.  Marland Kitchen triamcinolone (NASACORT AQ) 55 MCG/ACT AERO nasal inhaler Place 1 spray into the  nose daily.     Allergies:   Imitrex [sumatriptan]   Social History   Tobacco Use  . Smoking status: Never Smoker  . Smokeless tobacco: Never Used  Substance Use Topics  . Alcohol use: Yes    Comment: 2-4 a week  . Drug use: No     Family Hx: The patient's family history includes Diabetes in his father.  ROS:   Please see the history of present illness.     All other systems reviewed and are negative.   Labs/Other Tests and Data Reviewed:    Recent Labs: No results found for requested labs within last 8760 hours.   Recent Lipid Panel Lab Results  Component Value Date/Time   CHOL 122 08/14/2017 08:22 AM   TRIG 86 08/14/2017 08:22 AM   HDL 39 (L) 08/14/2017 08:22 AM   CHOLHDL 3.1 08/14/2017 08:22 AM   LDLCALC 66 08/14/2017 08:22 AM    Wt Readings from Last 3 Encounters:  08/11/19 230 lb (104.3 kg)  11/13/18 242 lb (109.8 kg)  03/28/17 232 lb 12.8 oz (105.6 kg)     Objective:    Vital Signs:  BP 120/80   Ht 5\' 8"  (1.727 m)   Wt 230 lb (104.3 kg)   BMI 34.97 kg/m     ASSESSMENT & PLAN:    1.  OSA -  The patient is tolerating PAP therapy well without any problems. I will get a download from his DME.  The patient has been using and benefiting from PAP use and will continue to benefit from therapy. His device is getting old and I will order a new ResMed BiPAP at 11/7cm H2O with heated humidity and mask of choice.  He will followup with me in 8 weeks after getting a new device.  2.  HTN -BP controlled on exam -continue Toprol XL 25mg  daily  3.  Obesity -I have encouraged him to get into a routine exercise program and cut back on carbs and portions.   Time:   Today, I have spent 20 minutes on telemedicine discussing medical problems including OSA, HTN, obesity and reviewing patient's chart including PAP compliance download.  Medication Adjustments/Labs and Tests Ordered: Current medicines are reviewed at length with the patient today.  Concerns regarding  medicines are outlined above.  Tests Ordered: No orders of the defined types were placed in this encounter.  Medication Changes: No orders of the defined types were placed in this encounter.   Disposition:  Follow up 8 weeks after getting new device.   Signed, Fransico Him, MD  08/11/2019 7:56 AM    [place]

## 2019-08-11 ENCOUNTER — Other Ambulatory Visit: Payer: Self-pay

## 2019-08-11 ENCOUNTER — Encounter: Payer: Self-pay | Admitting: Cardiology

## 2019-08-11 ENCOUNTER — Telehealth (INDEPENDENT_AMBULATORY_CARE_PROVIDER_SITE_OTHER): Payer: BC Managed Care – PPO | Admitting: Cardiology

## 2019-08-11 VITALS — BP 120/80 | Ht 68.0 in | Wt 230.0 lb

## 2019-08-11 DIAGNOSIS — I10 Essential (primary) hypertension: Secondary | ICD-10-CM | POA: Diagnosis not present

## 2019-08-11 DIAGNOSIS — E669 Obesity, unspecified: Secondary | ICD-10-CM | POA: Diagnosis not present

## 2019-08-11 DIAGNOSIS — G4733 Obstructive sleep apnea (adult) (pediatric): Secondary | ICD-10-CM

## 2019-08-12 ENCOUNTER — Telehealth: Payer: Self-pay | Admitting: *Deleted

## 2019-08-12 DIAGNOSIS — G4733 Obstructive sleep apnea (adult) (pediatric): Secondary | ICD-10-CM

## 2019-08-12 NOTE — Telephone Encounter (Signed)
Order placed to [employer] via community message 

## 2019-08-12 NOTE — Telephone Encounter (Signed)
-----   Message from Quintella Reichert, MD sent at 08/11/2019  8:01 AM EDT ----- Please order a ResMed BiPAP at 11/7cm H2O with heated humidity and mask of choice.  He will followup with me in 8 weeks after getting a new device.

## 2019-08-13 DIAGNOSIS — I252 Old myocardial infarction: Secondary | ICD-10-CM | POA: Diagnosis not present

## 2019-08-13 DIAGNOSIS — R9431 Abnormal electrocardiogram [ECG] [EKG]: Secondary | ICD-10-CM | POA: Diagnosis not present

## 2019-08-13 DIAGNOSIS — I493 Ventricular premature depolarization: Secondary | ICD-10-CM | POA: Diagnosis not present

## 2019-08-13 DIAGNOSIS — I2510 Atherosclerotic heart disease of native coronary artery without angina pectoris: Secondary | ICD-10-CM | POA: Diagnosis not present

## 2019-08-21 DIAGNOSIS — G4733 Obstructive sleep apnea (adult) (pediatric): Secondary | ICD-10-CM | POA: Diagnosis not present

## 2019-08-22 ENCOUNTER — Telehealth: Payer: Self-pay | Admitting: *Deleted

## 2019-08-22 NOTE — Telephone Encounter (Signed)
Patient has a 10 week follow up appointment scheduled for 09/24/19. Patient understands he needs to keep this appointment for insurance compliance. Patient was grateful for the call and thanked me.   

## 2019-08-22 NOTE — Telephone Encounter (Signed)
  Patient Consent for Virtual Visit         Christopher Mcpherson has provided verbal consent on 08/22/2019 for a virtual visit (video or telephone).   CONSENT FOR VIRTUAL VISIT FOR:  Christopher Mcpherson  By participating in this virtual visit I agree to the following:  I hereby voluntarily request, consent and authorize [facility] and its employed or contracted physicians, physician assistants, nurse practitioners or other licensed health care professionals (the Practitioner), to provide me with telemedicine health care services (the "Services") as deemed necessary by the treating Practitioner. I acknowledge and consent to receive the Services by the Practitioner via telemedicine. I understand that the telemedicine visit will involve communicating with the Practitioner through live audiovisual communication technology and the disclosure of certain medical information by electronic transmission. I acknowledge that I have been given the opportunity to request an in-person assessment or other available alternative prior to the telemedicine visit and am voluntarily participating in the telemedicine visit.  I understand that I have the right to withhold or withdraw my consent to the use of telemedicine in the course of my care at any time, without affecting my right to future care or treatment, and that the Practitioner or I may terminate the telemedicine visit at any time. I understand that I have the right to inspect all information obtained and/or recorded in the course of the telemedicine visit and may receive copies of available information for a reasonable fee.  I understand that some of the potential risks of receiving the Services via telemedicine include:  Marland Kitchen Delay or interruption in medical evaluation due to technological equipment failure or disruption; . Information transmitted may not be sufficient (e.g. poor resolution of images) to allow for appropriate medical decision making by the  Practitioner; and/or  . In rare instances, security protocols could fail, causing a breach of personal health information.  Furthermore, I acknowledge that it is my responsibility to provide information about my medical history, conditions and care that is complete and accurate to the best of my ability. I acknowledge that Practitioner's advice, recommendations, and/or decision may be based on factors not within their control, such as incomplete or inaccurate data provided by me or distortions of diagnostic images or specimens that may result from electronic transmissions. I understand that the practice of medicine is not an exact science and that Practitioner makes no warranties or guarantees regarding treatment outcomes. I acknowledge that a copy of this consent can be made available to me via my patient portal [facility] MyChart), or I can request a printed copy by calling the office of [facility].    I understand that my insurance will be billed for this visit.   I have read or had this consent read to me. . I understand the contents of this consent, which adequately explains the benefits and risks of the Services being provided via telemedicine.  . I have been provided ample opportunity to ask questions regarding this consent and the Services and have had my questions answered to my satisfaction. . I give my informed consent for the services to be provided through the use of telemedicine in my medical care

## 2019-09-21 DIAGNOSIS — G4733 Obstructive sleep apnea (adult) (pediatric): Secondary | ICD-10-CM | POA: Diagnosis not present

## 2019-09-23 NOTE — Progress Notes (Signed)
Virtual Visit via Telephone Note   This visit type was conducted due to national recommendations for restrictions regarding the COVID-19 Pandemic (e.g. social distancing) in an effort to limit this patient's exposure and mitigate transmission in our community.  Due to his co-morbid illnesses, this patient is at least at moderate risk for complications without adequate follow up.  This format is felt to be most appropriate for this patient at this time.  The patient did not have access to video technology/had technical difficulties with video requiring transitioning to audio format only (telephone).  All issues noted in this document were discussed and addressed.  No physical exam could be performed with this format.  Please refer to the patient's chart for his  consent to telehealth for [facility].  Evaluation Performed:  Follow-up visit  This visit type was conducted due to national recommendations for restrictions regarding the COVID-19 Pandemic (e.g. social distancing).  This format is felt to be most appropriate for this patient at this time.  All issues noted in this document were discussed and addressed.  No physical exam was performed (except for noted visual exam findings with Video Visits).  Please refer to the patient's chart (MyChart message for video visits and phone note for telephone visits) for the patient's consent to telehealth for [facility]  Date:  09/24/2019   ID:  Christopher Mcpherson, DOB [birth year], MRN [id number]  Patient Location:  Home  Provider location:   [place]  PCP:  Catha Gosselin, MD  Cardiologist:  Donato Schultz, MD  Sleep Medicine:   Armanda Magic, MD Electrophysiologist:  None   Chief Complaint:  OSA  History of Present Illness:    Christopher Mcpherson is a 64 y.o. male who presents via audio/video conferencing for a telehealth visit today.    Christopher Mcpherson is a 64 y.o. male with a hx of OSA and is on BiPAP on 11/7cm H2O. He saw me back in June  and wanted a new device so we ordered a new BiPAP and now he is back for evaluation per insurance requirements.  He is doing well with his PAP device and thinks that he has gotten used to it.  He tolerates the mask and feels the pressure is adequate.  Since going on PAP he feels rested in the am and has no significant daytime sleepiness.  He denies any significant mouth or nasal dryness or nasal congestion.  He does not think that he snores.  He thinks his device is getting old.   Prior CV studies:   The following studies were reviewed today: PAP compliance download  Past Medical History:  Diagnosis Date  . Acute myocardial infarction, subendocardial infarction, subsequent episode of care (HCC)   . Allergic rhinitis   . Allergic rhinitis   . Asthma    Mild intermittent  . CAD (coronary artery disease)    MI 2008 (cardio Dr. Caprice Kluver), catheterization showed a small ramus branch occluded, nonQ wave MI, ejection fraction 65%, occurred after using Imitrex for migraine, PTCA unsuccessful  . Dyslipidemia   . Hypercholesteremia   . Migraine    followed by neurologist Dr.Lewit  . Obesity   . Sleep apnea    Past Surgical History:  Procedure Laterality Date  . CARDIAC CATHETERIZATION     Showed a small ramus branch occluded,nonQ wave MI, ejection fraction 65% occured after using Imitrex for Migraine,PTCA unsuccessful  . NASAL SINUS SURGERY       Current Meds  Medication Sig  .  albuterol (PROAIR HFA) 108 (90 BASE) MCG/ACT inhaler Inhale 1-2 puffs into the lungs every 6 (six) hours as needed for wheezing or shortness of breath.   Marland Kitchen aspirin 81 MG tablet Take 81 mg by mouth daily.  Marland Kitchen atorvastatin (LIPITOR) 80 MG tablet TAKE 1 TABLET BY MOUTH DAILY AT 6 PM  . ezetimibe (ZETIA) 10 MG tablet Take 1 tablet (10 mg total) by mouth daily.  . metoprolol succinate (TOPROL-XL) 25 MG 24 hr tablet TAKE 1 TABLET BY MOUTH DAILY  . Multiple Vitamin (MULTIVITAMIN ADULT PO) Take by mouth.  . Omega-3 Fatty  Acids (FISH OIL) 1000 MG CAPS Take 2 capsules by mouth daily.   Marland Kitchen omeprazole (PRILOSEC) 20 MG capsule Take 20 mg by mouth daily.  Marland Kitchen triamcinolone (NASACORT AQ) 55 MCG/ACT AERO nasal inhaler Place 1 spray into the nose daily.     Allergies:   Imitrex [sumatriptan]   Social History   Tobacco Use  . Smoking status: Never Smoker  . Smokeless tobacco: Never Used  Substance Use Topics  . Alcohol use: Yes    Comment: 2-4 a week  . Drug use: No     Family Hx: The patient's family history includes Diabetes in his father.  ROS:   Please see the history of present illness.     All other systems reviewed and are negative.   Labs/Other Tests and Data Reviewed:    Recent Labs: No results found for requested labs within last 8760 hours.   Recent Lipid Panel Lab Results  Component Value Date/Time   CHOL 122 08/14/2017 08:22 AM   TRIG 86 08/14/2017 08:22 AM   HDL 39 (L) 08/14/2017 08:22 AM   CHOLHDL 3.1 08/14/2017 08:22 AM   LDLCALC 66 08/14/2017 08:22 AM    Wt Readings from Last 3 Encounters:  09/24/19 234 lb (106.1 kg)  08/11/19 230 lb (104.3 kg)  11/13/18 242 lb (109.8 kg)     Objective:    Vital Signs:  BP 118/71   Pulse (!) 54   Temp (!) 97.3 F (36.3 C)   Ht 5\' 8"  (1.727 m)   Wt 234 lb (106.1 kg)   BMI 35.58 kg/m     ASSESSMENT & PLAN:    1.  OSA - The patient is tolerating PAP therapy well without any problems. The PAP download was reviewed today and showed an AHI of 0.8/hr on 11/7 cm H2O with 100% compliance in using more than 4 hours nightly.  The patient has been using and benefiting from PAP use and will continue to benefit from therapy.   2.  HTN -Bp controlled on exam today -continue Toprol XL 25mg  daily  3.  Obesity -I have encouraged him to get into a routine exercise program and cut back on carbs and portions.  -he is trying to walk 2 times daily for 35 minutes each time  Time:   Today, I have spent 15 minutes on telemedicine discussing medical  problems including OSA, HTN, obesity and reviewing patient's chart including PAP compliance download.  Medication Adjustments/Labs and Tests Ordered: Current medicines are reviewed at length with the patient today.  Concerns regarding medicines are outlined above.  Tests Ordered: No orders of the defined types were placed in this encounter.  Medication Changes: No orders of the defined types were placed in this encounter.   Disposition:  Follow up 1 year  Signed, 13/7, MD  09/24/2019 8:20 AM    [facility]

## 2019-09-24 ENCOUNTER — Telehealth: Payer: Self-pay

## 2019-09-24 ENCOUNTER — Other Ambulatory Visit: Payer: Self-pay

## 2019-09-24 ENCOUNTER — Encounter: Payer: Self-pay | Admitting: Cardiology

## 2019-09-24 ENCOUNTER — Telehealth (INDEPENDENT_AMBULATORY_CARE_PROVIDER_SITE_OTHER): Payer: BC Managed Care – PPO | Admitting: Cardiology

## 2019-09-24 VITALS — BP 118/71 | HR 54 | Temp 97.3°F | Ht 68.0 in | Wt 234.0 lb

## 2019-09-24 DIAGNOSIS — E669 Obesity, unspecified: Secondary | ICD-10-CM

## 2019-09-24 DIAGNOSIS — G4733 Obstructive sleep apnea (adult) (pediatric): Secondary | ICD-10-CM

## 2019-09-24 DIAGNOSIS — I10 Essential (primary) hypertension: Secondary | ICD-10-CM

## 2019-09-24 NOTE — Telephone Encounter (Signed)
  Patient Consent for Virtual Visit         Christopher Mcpherson has provided verbal consent on 09/24/2019 for a virtual visit (video or telephone).   CONSENT FOR VIRTUAL VISIT FOR:  Christopher Mcpherson  By participating in this virtual visit I agree to the following:  I hereby voluntarily request, consent and authorize [facility] and its employed or contracted physicians, physician assistants, nurse practitioners or other licensed health care professionals (the Practitioner), to provide me with telemedicine health care services (the "Services") as deemed necessary by the treating Practitioner. I acknowledge and consent to receive the Services by the Practitioner via telemedicine. I understand that the telemedicine visit will involve communicating with the Practitioner through live audiovisual communication technology and the disclosure of certain medical information by electronic transmission. I acknowledge that I have been given the opportunity to request an in-person assessment or other available alternative prior to the telemedicine visit and am voluntarily participating in the telemedicine visit.  I understand that I have the right to withhold or withdraw my consent to the use of telemedicine in the course of my care at any time, without affecting my right to future care or treatment, and that the Practitioner or I may terminate the telemedicine visit at any time. I understand that I have the right to inspect all information obtained and/or recorded in the course of the telemedicine visit and may receive copies of available information for a reasonable fee.  I understand that some of the potential risks of receiving the Services via telemedicine include:  Marland Kitchen Delay or interruption in medical evaluation due to technological equipment failure or disruption; . Information transmitted may not be sufficient (e.g. poor resolution of images) to allow for appropriate medical decision making by the  Practitioner; and/or  . In rare instances, security protocols could fail, causing a breach of personal health information.  Furthermore, I acknowledge that it is my responsibility to provide information about my medical history, conditions and care that is complete and accurate to the best of my ability. I acknowledge that Practitioner's advice, recommendations, and/or decision may be based on factors not within their control, such as incomplete or inaccurate data provided by me or distortions of diagnostic images or specimens that may result from electronic transmissions. I understand that the practice of medicine is not an exact science and that Practitioner makes no warranties or guarantees regarding treatment outcomes. I acknowledge that a copy of this consent can be made available to me via my patient portal [facility] MyChart), or I can request a printed copy by calling the office of [facility].    I understand that my insurance will be billed for this visit.   I have read or had this consent read to me. . I understand the contents of this consent, which adequately explains the benefits and risks of the Services being provided via telemedicine.  . I have been provided ample opportunity to ask questions regarding this consent and the Services and have had my questions answered to my satisfaction. . I give my informed consent for the services to be provided through the use of telemedicine in my medical care

## 2019-09-25 ENCOUNTER — Other Ambulatory Visit: Payer: Self-pay | Admitting: Cardiology

## 2019-10-22 DIAGNOSIS — G4733 Obstructive sleep apnea (adult) (pediatric): Secondary | ICD-10-CM | POA: Diagnosis not present

## 2019-11-03 ENCOUNTER — Other Ambulatory Visit: Payer: Self-pay | Admitting: Cardiology

## 2019-11-21 DIAGNOSIS — G4733 Obstructive sleep apnea (adult) (pediatric): Secondary | ICD-10-CM | POA: Diagnosis not present

## 2019-12-11 ENCOUNTER — Other Ambulatory Visit: Payer: Self-pay | Admitting: Cardiology

## 2020-01-10 ENCOUNTER — Other Ambulatory Visit: Payer: Self-pay | Admitting: Cardiology

## 2020-05-17 ENCOUNTER — Other Ambulatory Visit: Payer: Self-pay

## 2020-05-17 ENCOUNTER — Encounter: Payer: Self-pay | Admitting: Cardiology

## 2020-05-17 ENCOUNTER — Ambulatory Visit (INDEPENDENT_AMBULATORY_CARE_PROVIDER_SITE_OTHER): Payer: BC Managed Care – PPO | Admitting: Cardiology

## 2020-05-17 VITALS — BP 140/86 | HR 56 | Ht 68.0 in | Wt 246.4 lb

## 2020-05-17 DIAGNOSIS — E782 Mixed hyperlipidemia: Secondary | ICD-10-CM

## 2020-05-17 DIAGNOSIS — I2583 Coronary atherosclerosis due to lipid rich plaque: Secondary | ICD-10-CM | POA: Diagnosis not present

## 2020-05-17 DIAGNOSIS — I2510 Atherosclerotic heart disease of native coronary artery without angina pectoris: Secondary | ICD-10-CM | POA: Diagnosis not present

## 2020-05-17 MED ORDER — EZETIMIBE 10 MG PO TABS: ORAL_TABLET | ORAL | 3 refills | Status: AC

## 2020-05-17 MED ORDER — ATORVASTATIN CALCIUM 80 MG PO TABS: 80.0000 mg | ORAL_TABLET | Freq: Every day | ORAL | 3 refills | Status: AC

## 2020-05-17 NOTE — Progress Notes (Signed)
Cardiology Office Note:    Date:  05/17/2020   ID:  Christopher Mcpherson, DOB 1955/02/24, MRN [id number]  PCP:  Catha Gosselin, MD   [facility]  Cardiologist:  Donato Schultz, MD  Advanced Practice Provider:  No care team member to display Electrophysiologist:  None       Referring MD: Catha Gosselin, MD     History of Present Illness:    Christopher Mcpherson is a 65 y.o. male here with coronary artery disease in 2008 small occlusion ramus branch not amenable to PCI here for follow-up.  Prior Cardiolite in 2017 showed no ischemia.  There have been some T wave abnormalities on EKG in the past.  He had a stress echocardiogram ordered by [facility] cardiology in 2020.  Overall he has been doing well.  No complaints of chest pain fevers chills nausea vomiting shortness of breath orthopnea.  Taking his medications.  Needs refills on atorvastatin and Zetia.   Past Medical History:  Diagnosis Date  . Acute myocardial infarction, subendocardial infarction, subsequent episode of care (HCC)   . Allergic rhinitis   . Allergic rhinitis   . Asthma    Mild intermittent  . CAD (coronary artery disease)    MI 2008 (cardio Dr. Caprice Kluver), catheterization showed a small ramus branch occluded, nonQ wave MI, ejection fraction 65%, occurred after using Imitrex for migraine, PTCA unsuccessful  . Dyslipidemia   . Hypercholesteremia   . Migraine    followed by neurologist Dr.Lewit  . Obesity   . Sleep apnea     Past Surgical History:  Procedure Laterality Date  . CARDIAC CATHETERIZATION     Showed a small ramus branch occluded,nonQ wave MI, ejection fraction 65% occured after using Imitrex for Migraine,PTCA unsuccessful  . NASAL SINUS SURGERY      Current Medications: Current Meds  Medication Sig  . albuterol (VENTOLIN HFA) 108 (90 Base) MCG/ACT inhaler Inhale 1-2 puffs into the lungs every 6 (six) hours as needed for wheezing or shortness of breath.   Marland Kitchen aspirin 81 MG tablet  Take 81 mg by mouth daily.  . metoprolol succinate (TOPROL-XL) 25 MG 24 hr tablet TAKE 1 TABLET BY MOUTH DAILY  . Multiple Vitamin (MULTIVITAMIN ADULT PO) Take by mouth.  . Omega-3 Fatty Acids (FISH OIL) 1000 MG CAPS Take 2 capsules by mouth daily.   Marland Kitchen omeprazole (PRILOSEC) 20 MG capsule Take 20 mg by mouth daily.  Marland Kitchen triamcinolone (NASACORT) 55 MCG/ACT AERO nasal inhaler Place 1 spray into the nose daily.  . [DISCONTINUED] atorvastatin (LIPITOR) 80 MG tablet Take 1 tablet (80 mg total) by mouth daily at 6 PM. Please make overdue appt with Dr. Anne Fu before anymore refills. Thank you 2nd attempt  . [DISCONTINUED] ezetimibe (ZETIA) 10 MG tablet TAKE 1 TABLET(10 MG) BY MOUTH DAILY     Allergies:   Imitrex [sumatriptan]   Social History   Socioeconomic History  . Marital status: Married    Spouse name: Not on file  . Number of children: Not on file  . Years of education: Not on file  . Highest education level: Not on file  Occupational History  . Not on file  Tobacco Use  . Smoking status: Never Smoker  . Smokeless tobacco: Never Used  Substance and Sexual Activity  . Alcohol use: Yes    Comment: 2-4 a week  . Drug use: No  . Sexual activity: Not on file  Other Topics Concern  . Not on file  Social History Narrative  . Not on file   Social Determinants of Health   Financial Resource Strain: Not on file  Food Insecurity: Not on file  Transportation Needs: Not on file  Physical Activity: Not on file  Stress: Not on file  Social Connections: Not on file     Family History: The patient's family history includes Diabetes in his father.  ROS:   Please see the history of present illness.     All other systems reviewed and are negative.  EKGs/Labs/Other Studies Reviewed:    The following studies were reviewed today: Notes from outside source reviewed, stress echo reviewed, lab work reviewed  EKG:  EKG is  ordered today.  The ekg ordered today demonstrates 05/17/2020-sinus  rhythm 56 with diffuse T wave abnormalities, deep Q waves noted precordial leads, somewhat biphasic in lead II.  T wave inversion noted in 1 and aVL as well.  No significant change from prior.  Recent Labs: No results found for requested labs within last 8760 hours.  Recent Lipid Panel    Component Value Date/Time   CHOL 122 08/14/2017 0822   TRIG 86 08/14/2017 0822   HDL 39 (L) 08/14/2017 0822   CHOLHDL 3.1 08/14/2017 0822   LDLCALC 66 08/14/2017 0822     Risk Assessment/Calculations:      Physical Exam:    VS:  BP 140/86   Pulse (!) 56   Ht 5\' 8"  (1.727 m)   Wt 246 lb 6.4 oz (111.8 kg)   SpO2 96%   BMI 37.46 kg/m     Wt Readings from Last 3 Encounters:  05/17/20 246 lb 6.4 oz (111.8 kg)  09/24/19 234 lb (106.1 kg)  08/11/19 230 lb (104.3 kg)     GEN:  Well nourished, well developed in no acute distress HEENT: Normal NECK: No JVD; No carotid bruits LYMPHATICS: No lymphadenopathy CARDIAC: RRR, no murmurs, rubs, gallops RESPIRATORY:  Clear to auscultation without rales, wheezing or rhonchi  ABDOMEN: Soft, non-tender, non-distended MUSCULOSKELETAL:  No edema; No deformity  SKIN: Warm and dry NEUROLOGIC:  Alert and oriented x 3 PSYCHIATRIC:  Normal affect   ASSESSMENT:    1. Coronary artery disease due to lipid rich plaque   2. Mixed hyperlipidemia   3. Morbid obesity (HCC)    PLAN:    In order of problems listed above:  Hyperlipidemia -On atorvastatin 80 as well as Zetia 10.  Doing well.  LDL goal less than 70. LDL 45  Coronary artery disease post MI -Prior MI occurred after using Imitrex for migraine headache. -Small ramus lesion not amenable to PCI.  Currently on low-dose beta-blocker, aspirin, high intensity statin. -Stress echocardiogram showed no evidence of ischemia on 08/13/2019.  This was done at [facility].  Aortic valve had trace regurgitation.  Obstructive sleep apnea)-followed by Dr. [facility], treated with BiPAP.  Abnormal EKG -As described  above, T wave inversions noted.  Elevated blood pressure without diagnosis of hypertension -Blood pressure today was 140/86.  Usually it is in the 120 range.  I have asked him to monitor at home.  Morbid obesity -BMI is greater than 35 with 2 more comorbidities.  Encourage daily exercise, decrease carbohydrates, sugars, more vegetables, Mediterranean diet.    Medication Adjustments/Labs and Tests Ordered: Current medicines are reviewed at length with the patient today.  Concerns regarding medicines are outlined above.  Orders Placed This Encounter  Procedures  . EKG 12-Lead   Meds ordered this encounter  Medications  . ezetimibe (ZETIA) 10  MG tablet    Sig: TAKE 1 TABLET(10 MG) BY MOUTH DAILY    Dispense:  90 tablet    Refill:  3  . atorvastatin (LIPITOR) 80 MG tablet    Sig: Take 1 tablet (80 mg total) by mouth daily at 6 PM.    Dispense:  90 tablet    Refill:  3    Patient Instructions  Medication Instructions:  Your physician recommends that you continue on your current medications as directed. Please refer to the Current Medication list given to you today.  *If you need a refill on your cardiac medications before your next appointment, please call your pharmacy*   Lab Work: none If you have labs (blood work) drawn today and your tests are completely normal, you will receive your results only by: Marland Kitchen MyChart Message (if you have MyChart) OR . A paper copy in the mail If you have any lab test that is abnormal or we need to change your treatment, we will call you to review the results.   Testing/Procedures: none   Follow-Up: At [facility], you and your health needs are our priority.  As part of our continuing mission to provide you with exceptional heart care, we have created designated Provider Care Teams.  These Care Teams include your primary Cardiologist (physician) and Advanced Practice Providers (APPs -  Physician Assistants and Nurse Practitioners) who all  work together to provide you with the care you need, when you need it.  We recommend signing up for the patient portal called "MyChart".  Sign up information is provided on this After Visit Summary.  MyChart is used to connect with patients for Virtual Visits (Telemedicine).  Patients are able to view lab/test results, encounter notes, upcoming appointments, etc.  Non-urgent messages can be sent to your provider as well.   To learn more about what you can do with MyChart, go to [email or web address].    Your next appointment:   12 month(s)  The format for your next appointment:   In Person  Provider:   You may see Donato Schultz, MD or one of the following Advanced Practice Providers on your designated Care Team:    Georgie Chard, NP    Other Instructions      Signed, Donato Schultz, MD  05/17/2020 8:48 AM    [facility]

## 2020-05-17 NOTE — Patient Instructions (Signed)
Medication Instructions:  Your physician recommends that you continue on your current medications as directed. Please refer to the Current Medication list given to you today.  *If you need a refill on your cardiac medications before your next appointment, please call your pharmacy*   Lab Work: none If you have labs (blood work) drawn today and your tests are completely normal, you will receive your results only by: . MyChart Message (if you have MyChart) OR . A paper copy in the mail If you have any lab test that is abnormal or we need to change your treatment, we will call you to review the results.   Testing/Procedures: none   Follow-Up: At [facility], you and your health needs are our priority.  As part of our continuing mission to provide you with exceptional heart care, we have created designated Provider Care Teams.  These Care Teams include your primary Cardiologist (physician) and Advanced Practice Providers (APPs -  Physician Assistants and Nurse Practitioners) who all work together to provide you with the care you need, when you need it.  We recommend signing up for the patient portal called "MyChart".  Sign up information is provided on this After Visit Summary.  MyChart is used to connect with patients for Virtual Visits (Telemedicine).  Patients are able to view lab/test results, encounter notes, upcoming appointments, etc.  Non-urgent messages can be sent to your provider as well.   To learn more about what you can do with MyChart, go to [email or web address].    Your next appointment:   12 month(s)  The format for your next appointment:   In Person  Provider:   You may see Mark Skains, MD or one of the following Advanced Practice Providers on your designated Care Team:    Jill McDaniel, NP    Other Instructions   

## 2020-05-31 DIAGNOSIS — Z125 Encounter for screening for malignant neoplasm of prostate: Secondary | ICD-10-CM | POA: Diagnosis not present

## 2020-05-31 DIAGNOSIS — I2510 Atherosclerotic heart disease of native coronary artery without angina pectoris: Secondary | ICD-10-CM | POA: Diagnosis not present

## 2020-05-31 DIAGNOSIS — E782 Mixed hyperlipidemia: Secondary | ICD-10-CM | POA: Diagnosis not present

## 2020-06-07 ENCOUNTER — Other Ambulatory Visit: Payer: Self-pay | Admitting: Cardiology

## 2020-06-09 DIAGNOSIS — Z0000 Encounter for general adult medical examination without abnormal findings: Secondary | ICD-10-CM | POA: Diagnosis not present

## 2021-03-21 DIAGNOSIS — U071 COVID-19: Secondary | ICD-10-CM | POA: Diagnosis not present

## 2021-06-14 DIAGNOSIS — I2510 Atherosclerotic heart disease of native coronary artery without angina pectoris: Secondary | ICD-10-CM | POA: Diagnosis not present

## 2021-06-20 DIAGNOSIS — Z0000 Encounter for general adult medical examination without abnormal findings: Secondary | ICD-10-CM | POA: Diagnosis not present

## 2021-07-30 ENCOUNTER — Other Ambulatory Visit: Payer: Self-pay | Admitting: Cardiology

## 2021-08-10 ENCOUNTER — Ambulatory Visit (INDEPENDENT_AMBULATORY_CARE_PROVIDER_SITE_OTHER): Payer: BC Managed Care – PPO | Admitting: Cardiology

## 2021-08-10 VITALS — BP 130/74 | HR 73 | Ht 68.0 in | Wt 246.4 lb

## 2021-08-10 DIAGNOSIS — I2510 Atherosclerotic heart disease of native coronary artery without angina pectoris: Secondary | ICD-10-CM

## 2021-08-10 DIAGNOSIS — G4733 Obstructive sleep apnea (adult) (pediatric): Secondary | ICD-10-CM | POA: Diagnosis not present

## 2021-08-10 DIAGNOSIS — I252 Old myocardial infarction: Secondary | ICD-10-CM

## 2021-08-10 DIAGNOSIS — I2583 Coronary atherosclerosis due to lipid rich plaque: Secondary | ICD-10-CM

## 2021-08-10 DIAGNOSIS — E7800 Pure hypercholesterolemia, unspecified: Secondary | ICD-10-CM | POA: Diagnosis not present

## 2021-08-10 NOTE — Assessment & Plan Note (Signed)
Doing very well with CPAP.  Very compliant.  Cannot sleep without it he states.

## 2021-08-10 NOTE — Patient Instructions (Signed)
Medication Instructions:  Your Physician recommend you continue on your current medication as directed.    *If you need a refill on your cardiac medications before your next appointment, please call your pharmacy*   Lab Work: None ordered today   Testing/Procedures: None ordered today    Follow-Up: At [facility], you and your health needs are our priority.  As part of our continuing mission to provide you with exceptional heart care, we have created designated Provider Care Teams.  These Care Teams include your primary Cardiologist (physician) and Advanced Practice Providers (APPs -  Physician Assistants and Nurse Practitioners) who all work together to provide you with the care you need, when you need it.  We recommend signing up for the patient portal called "MyChart".  Sign up information is provided on this After Visit Summary.  MyChart is used to connect with patients for Virtual Visits (Telemedicine).  Patients are able to view lab/test results, encounter notes, upcoming appointments, etc.  Non-urgent messages can be sent to your provider as well.   To learn more about what you can do with MyChart, go to [email or web address].    Your next appointment:   1 year(s)  The format for your next appointment:   In Person  Provider:   Donato Schultz, MD {  Important Information About Sugar

## 2021-08-10 NOTE — Progress Notes (Signed)
Cardiology Office Note:    Date:  08/10/2021   ID:  Christopher Mcpherson, DOB 02-Feb-1956, MRN [id number]  PCP:  Christopher Gosselin, MD   [facility] Providers Cardiologist:  Donato Schultz, MD Sleep Medicine:  Armanda Magic, MD     Referring MD: Christopher Gosselin, MD    History of Present Illness:    Christopher Mcpherson is a 66 y.o. male here for the follow-up of coronary artery disease diagnosed in 2008 with small occlusion of the ramus branch not amenable to PCI.  He said that this occurred after using Imitrex for a migraine headache.    Cardiolite in 2017 showed no ischemia.  There were T wave abnormalities on EKG in the past.  Stress echocardiogram performed at [facility] in 2020 was unremarkable.  Overall been doing quite well.  No fevers chills nausea vomiting chest pain.  Has been taking atorvastatin and Zetia without issues.  He was curious about the [place] well at [facility].  Asked about weight loss.  Currently undergoing a home renovation.  Past Medical History:  Diagnosis Date   Acute myocardial infarction, subendocardial infarction, subsequent episode of care [facility]    Allergic rhinitis    Allergic rhinitis    Asthma    Mild intermittent   CAD (coronary artery disease)    MI 2008 (cardio Dr. Caprice Kluver), catheterization showed a small ramus branch occluded, nonQ wave MI, ejection fraction 65%, occurred after using Imitrex for migraine, PTCA unsuccessful   Dyslipidemia    Hypercholesteremia    Migraine    followed by neurologist Dr.Lewit   Obesity    Sleep apnea     Past Surgical History:  Procedure Laterality Date   CARDIAC CATHETERIZATION     Showed a small ramus branch occluded,nonQ wave MI, ejection fraction 65% occured after using Imitrex for Migraine,PTCA unsuccessful   NASAL SINUS SURGERY      Current Medications: Current Meds  Medication Sig   albuterol (VENTOLIN HFA) 108 (90 Base) MCG/ACT inhaler Inhale 1-2 puffs into the lungs every 6 (six) hours as needed for  wheezing or shortness of breath.    aspirin 81 MG tablet Take 81 mg by mouth daily.   atorvastatin (LIPITOR) 80 MG tablet Take 1 tablet (80 mg total) by mouth daily at 6 PM.   ezetimibe (ZETIA) 10 MG tablet TAKE 1 TABLET(10 MG) BY MOUTH DAILY   metoprolol succinate (TOPROL-XL) 25 MG 24 hr tablet TAKE 1 TABLET BY MOUTH DAILY   Multiple Vitamin (MULTIVITAMIN ADULT PO) Take by mouth.   Omega-3 Fatty Acids (FISH OIL) 1000 MG CAPS Take 2 capsules by mouth daily.    omeprazole (PRILOSEC) 20 MG capsule Take 20 mg by mouth daily.   triamcinolone (NASACORT) 55 MCG/ACT AERO nasal inhaler Place 1 spray into the nose daily.     Allergies:   Imitrex [sumatriptan]   Social History   Socioeconomic History   Marital status: Married    Spouse name: Not on file   Number of children: Not on file   Years of education: Not on file   Highest education level: Not on file  Occupational History   Not on file  Tobacco Use   Smoking status: Never   Smokeless tobacco: Never  Substance and Sexual Activity   Alcohol use: Yes    Comment: 2-4 a week   Drug use: No   Sexual activity: Not on file  Other Topics Concern   Not on file  Social History Narrative   Not on  file   Social Determinants of Health   Financial Resource Strain: Not on file  Food Insecurity: Not on file  Transportation Needs: Not on file  Physical Activity: Not on file  Stress: Not on file  Social Connections: Not on file     Family History: The patient's family history includes Diabetes in his father.  ROS:   Please see the history of present illness.     All other systems reviewed and are negative.  EKGs/Labs/Other Studies Reviewed:    The following studies were reviewed today: Prior cardiac catheterization, EKG reviewed.  Small ramus lesion not amenable to PCI.  Currently on low-dose beta-blocker, aspirin, high intensity statin. -Stress echocardiogram showed no evidence of ischemia on 08/13/2019.  This was done at [facility]   Aortic valve had trace regurgitation.  EKG:  EKG is  ordered today.  The ekg ordered today demonstrates normal sinus rhythm 73 T wave inversion in lateral as well as precordial leads, unchanged from prior  Recent Labs: No results found for requested labs within last 365 days.  Recent Lipid Panel    Component Value Date/Time   CHOL 122 08/14/2017 0822   TRIG 86 08/14/2017 0822   HDL 39 (L) 08/14/2017 0822   CHOLHDL 3.1 08/14/2017 0822   LDLCALC 66 08/14/2017 0822     Risk Assessment/Calculations:              Physical Exam:    VS:  BP 130/74 (BP Location: Left Arm, Patient Position: Sitting, Cuff Size: Large)   Pulse 73   Ht 5\' 8"  (1.727 m)   Wt 246 lb 6.4 oz (111.8 kg)   BMI 37.46 kg/m     Wt Readings from Last 3 Encounters:  08/10/21 246 lb 6.4 oz (111.8 kg)  05/17/20 246 lb 6.4 oz (111.8 kg)  09/24/19 234 lb (106.1 kg)     GEN:  Well nourished, well developed in no acute distress HEENT: Normal NECK: No JVD; No carotid bruits LYMPHATICS: No lymphadenopathy CARDIAC: RRR, no murmurs, no rubs, gallops RESPIRATORY:  Clear to auscultation without rales, wheezing or rhonchi  ABDOMEN: Soft, non-tender, non-distended MUSCULOSKELETAL:  No edema; No deformity  SKIN: Warm and dry NEUROLOGIC:  Alert and oriented x 3 PSYCHIATRIC:  Normal affect   ASSESSMENT:    1. Coronary artery disease due to lipid rich plaque   2. Old MI (myocardial infarction)   3. Pure hypercholesterolemia   4. OSA (obstructive sleep apnea)    PLAN:    In order of problems listed above:  CAD (coronary artery disease) Catheterization 2008 non-STEMI small ramus lesion not amenable to PCI Stress echocardiogram showed no ischemia in 2021 at [facility].  Aortic valve had trace regurgitation.  Continuing with low-dose beta-blocker Toprol 25 mg once a day, aspirin 81 mg a day, atorvastatin 80 mg a day and Zetia 10 mg a day.  No change in medicine management.  Old MI (myocardial  infarction) 2008.  Pure hypercholesterolemia Atorvastatin 80 Zetia 10.  Last LDL 60.  Excellent.  Continue with current medication management.  No myalgias.  OSA (obstructive sleep apnea) Doing very well with CPAP.  Very compliant.  Cannot sleep without it he states.         Medication Adjustments/Labs and Tests Ordered: Current medicines are reviewed at length with the patient today.  Concerns regarding medicines are outlined above.  Orders Placed This Encounter  Procedures   EKG 12-Lead   No orders of the defined types were placed in this encounter.  Patient Instructions  Medication Instructions:  Your Physician recommend you continue on your current medication as directed.    *If you need a refill on your cardiac medications before your next appointment, please call your pharmacy*   Lab Work: None ordered today   Testing/Procedures: None ordered today    Follow-Up: At [facility], you and your health needs are our priority.  As part of our continuing mission to provide you with exceptional heart care, we have created designated Provider Care Teams.  These Care Teams include your primary Cardiologist (physician) and Advanced Practice Providers (APPs -  Physician Assistants and Nurse Practitioners) who all work together to provide you with the care you need, when you need it.  We recommend signing up for the patient portal called "MyChart".  Sign up information is provided on this After Visit Summary.  MyChart is used to connect with patients for Virtual Visits (Telemedicine).  Patients are able to view lab/test results, encounter notes, upcoming appointments, etc.  Non-urgent messages can be sent to your provider as well.   To learn more about what you can do with MyChart, go to [email or web address].    Your next appointment:   1 year(s)  The format for your next appointment:   In Person  Provider:   Candee Furbish, MD {  Important Information About  [place]         Signed, Candee Furbish, MD  08/10/2021 9:18 AM    [place]

## 2021-08-10 NOTE — Assessment & Plan Note (Signed)
2008

## 2021-08-10 NOTE — Assessment & Plan Note (Signed)
Catheterization 2008 non-STEMI small ramus lesion not amenable to PCI Stress echocardiogram showed no ischemia in 2021 at [facility].  Aortic valve had trace regurgitation.  Continuing with low-dose beta-blocker Toprol 25 mg once a day, aspirin 81 mg a day, atorvastatin 80 mg a day and Zetia 10 mg a day.  No change in medicine management.

## 2021-08-10 NOTE — Assessment & Plan Note (Signed)
Atorvastatin 80 Zetia 10.  Last LDL 60.  Excellent.  Continue with current medication management.  No myalgias.

## 2021-09-05 ENCOUNTER — Ambulatory Visit: Payer: BC Managed Care – PPO | Admitting: Cardiology

## 2022-02-16 DIAGNOSIS — L851 Acquired keratosis [keratoderma] palmaris et plantaris: Secondary | ICD-10-CM | POA: Diagnosis not present

## 2022-02-16 DIAGNOSIS — L853 Xerosis cutis: Secondary | ICD-10-CM | POA: Diagnosis not present

## 2022-02-16 DIAGNOSIS — L308 Other specified dermatitis: Secondary | ICD-10-CM | POA: Diagnosis not present

## 2022-06-20 DIAGNOSIS — E782 Mixed hyperlipidemia: Secondary | ICD-10-CM | POA: Diagnosis not present

## 2022-06-20 LAB — LAB REPORT - SCANNED: EGFR: 79

## 2022-06-26 DIAGNOSIS — E782 Mixed hyperlipidemia: Secondary | ICD-10-CM | POA: Diagnosis not present

## 2022-06-26 DIAGNOSIS — J4520 Mild intermittent asthma, uncomplicated: Secondary | ICD-10-CM | POA: Diagnosis not present

## 2022-06-26 DIAGNOSIS — Z0000 Encounter for general adult medical examination without abnormal findings: Secondary | ICD-10-CM | POA: Diagnosis not present

## 2022-06-26 DIAGNOSIS — K219 Gastro-esophageal reflux disease without esophagitis: Secondary | ICD-10-CM | POA: Diagnosis not present

## 2022-07-12 DIAGNOSIS — Z09 Encounter for follow-up examination after completed treatment for conditions other than malignant neoplasm: Secondary | ICD-10-CM | POA: Diagnosis not present

## 2022-07-12 DIAGNOSIS — Z86010 Personal history of colonic polyps: Secondary | ICD-10-CM | POA: Diagnosis not present

## 2022-08-21 DIAGNOSIS — K5730 Diverticulosis of large intestine without perforation or abscess without bleeding: Secondary | ICD-10-CM | POA: Diagnosis not present

## 2022-08-21 DIAGNOSIS — Z09 Encounter for follow-up examination after completed treatment for conditions other than malignant neoplasm: Secondary | ICD-10-CM | POA: Diagnosis not present

## 2022-08-21 DIAGNOSIS — Z86010 Personal history of colonic polyps: Secondary | ICD-10-CM | POA: Diagnosis not present

## 2022-08-21 DIAGNOSIS — D122 Benign neoplasm of ascending colon: Secondary | ICD-10-CM | POA: Diagnosis not present

## 2023-07-19 DIAGNOSIS — Z6836 Body mass index (BMI) 36.0-36.9, adult: Secondary | ICD-10-CM | POA: Diagnosis not present

## 2023-07-19 DIAGNOSIS — I2510 Atherosclerotic heart disease of native coronary artery without angina pectoris: Secondary | ICD-10-CM | POA: Diagnosis not present

## 2023-07-19 DIAGNOSIS — I252 Old myocardial infarction: Secondary | ICD-10-CM | POA: Diagnosis not present

## 2023-07-19 DIAGNOSIS — Z0000 Encounter for general adult medical examination without abnormal findings: Secondary | ICD-10-CM | POA: Diagnosis not present

## 2023-07-19 DIAGNOSIS — E782 Mixed hyperlipidemia: Secondary | ICD-10-CM | POA: Diagnosis not present

## 2023-07-25 ENCOUNTER — Ambulatory Visit: Payer: Self-pay | Admitting: Cardiology

## 2023-11-02 DIAGNOSIS — B354 Tinea corporis: Secondary | ICD-10-CM | POA: Diagnosis not present
